# Patient Record
Sex: Male | Born: 1945 | Race: White | Hispanic: No | Marital: Married | Smoking: Former smoker
Health system: Southern US, Community
[De-identification: ages and names within clinical notes are randomized; demographics above are authoritative.]

## PROBLEM LIST (undated history)

## (undated) DIAGNOSIS — E785 Hyperlipidemia, unspecified: Secondary | ICD-10-CM

## (undated) DIAGNOSIS — Z87442 Personal history of urinary calculi: Secondary | ICD-10-CM

## (undated) DIAGNOSIS — G629 Polyneuropathy, unspecified: Secondary | ICD-10-CM

## (undated) DIAGNOSIS — M069 Rheumatoid arthritis, unspecified: Secondary | ICD-10-CM

## (undated) DIAGNOSIS — I10 Essential (primary) hypertension: Secondary | ICD-10-CM

## (undated) DIAGNOSIS — F419 Anxiety disorder, unspecified: Secondary | ICD-10-CM

## (undated) DIAGNOSIS — M1990 Unspecified osteoarthritis, unspecified site: Secondary | ICD-10-CM

## (undated) DIAGNOSIS — F329 Major depressive disorder, single episode, unspecified: Secondary | ICD-10-CM

## (undated) DIAGNOSIS — F32A Depression, unspecified: Secondary | ICD-10-CM

## (undated) DIAGNOSIS — M549 Dorsalgia, unspecified: Secondary | ICD-10-CM

## (undated) DIAGNOSIS — G8929 Other chronic pain: Secondary | ICD-10-CM

## (undated) DIAGNOSIS — G709 Myoneural disorder, unspecified: Secondary | ICD-10-CM

## (undated) DIAGNOSIS — K219 Gastro-esophageal reflux disease without esophagitis: Secondary | ICD-10-CM

## (undated) HISTORY — PX: OTHER SURGICAL HISTORY: SHX169

## (undated) HISTORY — PX: COLONOSCOPY: SHX174

## (undated) HISTORY — PX: HAND / FINGER LESION EXCISION: SUR531

## (undated) HISTORY — DX: Rheumatoid arthritis, unspecified: M06.9

## (undated) HISTORY — PX: TONSILLECTOMY: SUR1361

---

## 2007-07-27 ENCOUNTER — Ambulatory Visit: Payer: Self-pay | Admitting: Cardiology

## 2013-06-03 ENCOUNTER — Other Ambulatory Visit: Payer: Self-pay | Admitting: Neurosurgery

## 2013-06-10 ENCOUNTER — Encounter (HOSPITAL_COMMUNITY): Payer: Self-pay | Admitting: Pharmacy Technician

## 2013-06-12 ENCOUNTER — Encounter (HOSPITAL_COMMUNITY): Payer: Self-pay

## 2013-06-12 ENCOUNTER — Encounter (HOSPITAL_COMMUNITY)
Admission: RE | Admit: 2013-06-12 | Discharge: 2013-06-12 | Disposition: A | Discharge status: Home / Self Care | Payer: Medicare Other | Source: Ambulatory Visit | Attending: Anesthesiology | Admitting: Anesthesiology

## 2013-06-12 ENCOUNTER — Encounter (HOSPITAL_COMMUNITY)
Admission: RE | Admit: 2013-06-12 | Discharge: 2013-06-12 | Disposition: A | Discharge status: Home / Self Care | Payer: Medicare Other | Source: Ambulatory Visit | Attending: Neurosurgery | Admitting: Neurosurgery

## 2013-06-12 HISTORY — DX: Gastro-esophageal reflux disease without esophagitis: K21.9

## 2013-06-12 HISTORY — DX: Essential (primary) hypertension: I10

## 2013-06-12 HISTORY — DX: Anxiety disorder, unspecified: F41.9

## 2013-06-12 HISTORY — DX: Personal history of urinary calculi: Z87.442

## 2013-06-12 LAB — CBC
HCT: 41.4 % (ref 39.0–52.0)
MCH: 33.6 pg (ref 26.0–34.0)
MCHC: 36.5 g/dL — ABNORMAL HIGH (ref 30.0–36.0)
MCV: 92.2 fL (ref 78.0–100.0)
RDW: 12.4 % (ref 11.5–15.5)

## 2013-06-12 LAB — BASIC METABOLIC PANEL
BUN: 18 mg/dL (ref 6–23)
Chloride: 101 mEq/L (ref 96–112)
Creatinine, Ser: 1.16 mg/dL (ref 0.50–1.35)
GFR calc Af Amer: 75 mL/min — ABNORMAL LOW (ref >90)

## 2013-06-12 LAB — SURGICAL PCR SCREEN: MRSA, PCR: NEGATIVE

## 2013-06-12 LAB — TYPE AND SCREEN: Antibody Screen: NEGATIVE

## 2013-06-12 NOTE — Progress Notes (Signed)
req'd stress 08 from [place]  Dr Onalee Hua tapper rec'es. req' ekg to compare. Non done per office.

## 2013-06-12 NOTE — Pre-Procedure Instructions (Addendum)
Ryan Ibarra  06/12/2013   Your procedure is scheduled on:  06/19/13  Report to [facility] at1000 AM.  Call this number if you have problems the morning of surgery: [phone]   Remember:   Do not eat food or drink liquids after midnight.   Take these medicines the morning of surgery with A SIP OF WATER: pain med, paxil, zantac,xanax   Do not wear jewelry, make-up or nail polish.  Do not wear lotions, powders, or perfumes. You may wear deodorant.  Do not shave 48 hours prior to surgery. Men may shave face and neck.  Do not bring valuables to the hospital.  [facility] is not responsible                   for any belongings or valuables.  Contacts, dentures or bridgework may not be worn into surgery.  Leave suitcase in the car. After surgery it may be brought to your room.  For patients admitted to the hospital, checkout time is 11:00 AM the day of  discharge.   Patients discharged the day of surgery will not be allowed to drive  home.  Name and phone number of your driver:   Special Instructions: Shower using CHG 2 nights before surgery and the night before surgery.  If you shower the day of surgery use CHG.  Use special wash - you have one bottle of CHG for all showers.  You should use approximately 1/3 of the bottle for each shower.   Please read over the following fact sheets that you were given: Pain Booklet, Coughing and Deep Breathing, Blood Transfusion Information, MRSA Information and Surgical Site Infection Prevention

## 2013-06-14 NOTE — Progress Notes (Signed)
This is a 67 year old male who is scheduled for multilevel lumbar spine surgery/fusion to be performed by Dr. Coletta Memos on Wednesday 19 June 2013.  A chart review was requested due to an abnormal EKG dated 12 June 2013 which was read as: Normal sinus rhythm with a ventricular rate of 65, septal infract of undetermined age, abnormal EKG, no old tracings to compare.   An EKG dated 11 July 2007 was read as normal. When compared, there appear to be no significant changes. Both tracings appear to be very similar  An exercise stress test was performed at [facility] in [place] in [place] 2008 secondary to "multiple cardiac risk factors and substernal chest pain. "  IMPRESSION: Clinically negative for ischemia. Electrocardiographically negative for ischemia with good exercise tolerance. Perfusion data were essentially negative for ischemia.   Risk factors for coronary artery disease include HTN and tobacco abuse. Past medical history is significant for HTN, COPD and GERD.   Lab results dated 12 June 2013 are unremarkable.  CXR dated 12 June 2013: Emphysema without acute cardiopulmonary disease.  This case was discussed with Dr. Ivin Booty.  May proceed with surgery as scheduled.  Kelton Pillar. Everhart, PA-C

## 2013-06-18 MED ORDER — CEFAZOLIN SODIUM-DEXTROSE 2-3 GM-% IV SOLR
2.0000 g | INTRAVENOUS | Status: AC
  Administered 2013-06-19 (×3): 2 g via INTRAVENOUS
  Filled 2013-06-18: qty 50

## 2013-06-19 ENCOUNTER — Encounter (HOSPITAL_COMMUNITY): Payer: Self-pay | Admitting: *Deleted

## 2013-06-19 ENCOUNTER — Inpatient Hospital Stay (HOSPITAL_COMMUNITY)
Admission: RE | Admit: 2013-06-19 | Discharge: 2013-06-27 | DRG: 459 | Disposition: A | Discharge status: Home / Self Care | Payer: Medicare Other | Source: Ambulatory Visit | Attending: Neurosurgery | Admitting: Neurosurgery

## 2013-06-19 ENCOUNTER — Encounter (HOSPITAL_COMMUNITY): Payer: Self-pay | Admitting: Anesthesiology

## 2013-06-19 ENCOUNTER — Inpatient Hospital Stay (HOSPITAL_COMMUNITY): Payer: Medicare Other | Admitting: Anesthesiology

## 2013-06-19 ENCOUNTER — Inpatient Hospital Stay (HOSPITAL_COMMUNITY): Payer: Medicare Other

## 2013-06-19 ENCOUNTER — Encounter (HOSPITAL_COMMUNITY)
Admission: RE | Disposition: A | Discharge status: Home / Self Care | Payer: Self-pay | Source: Ambulatory Visit | Attending: Neurosurgery

## 2013-06-19 DIAGNOSIS — G609 Hereditary and idiopathic neuropathy, unspecified: Secondary | ICD-10-CM | POA: Diagnosis present

## 2013-06-19 DIAGNOSIS — Z7982 Long term (current) use of aspirin: Secondary | ICD-10-CM

## 2013-06-19 DIAGNOSIS — Y9210 Unspecified residential institution as the place of occurrence of the external cause: Secondary | ICD-10-CM | POA: Diagnosis not present

## 2013-06-19 DIAGNOSIS — Z01812 Encounter for preprocedural laboratory examination: Secondary | ICD-10-CM

## 2013-06-19 DIAGNOSIS — Z01818 Encounter for other preprocedural examination: Secondary | ICD-10-CM

## 2013-06-19 DIAGNOSIS — D62 Acute posthemorrhagic anemia: Secondary | ICD-10-CM | POA: Diagnosis not present

## 2013-06-19 DIAGNOSIS — F411 Generalized anxiety disorder: Secondary | ICD-10-CM | POA: Diagnosis present

## 2013-06-19 DIAGNOSIS — Z01810 Encounter for preprocedural cardiovascular examination: Secondary | ICD-10-CM

## 2013-06-19 DIAGNOSIS — J4489 Other specified chronic obstructive pulmonary disease: Secondary | ICD-10-CM | POA: Diagnosis present

## 2013-06-19 DIAGNOSIS — Z981 Arthrodesis status: Secondary | ICD-10-CM

## 2013-06-19 DIAGNOSIS — M4716 Other spondylosis with myelopathy, lumbar region: Secondary | ICD-10-CM | POA: Diagnosis present

## 2013-06-19 DIAGNOSIS — J449 Chronic obstructive pulmonary disease, unspecified: Secondary | ICD-10-CM | POA: Diagnosis present

## 2013-06-19 DIAGNOSIS — J95821 Acute postprocedural respiratory failure: Secondary | ICD-10-CM | POA: Diagnosis not present

## 2013-06-19 DIAGNOSIS — G959 Disease of spinal cord, unspecified: Secondary | ICD-10-CM | POA: Diagnosis present

## 2013-06-19 DIAGNOSIS — I10 Essential (primary) hypertension: Secondary | ICD-10-CM | POA: Diagnosis present

## 2013-06-19 DIAGNOSIS — M5126 Other intervertebral disc displacement, lumbar region: Secondary | ICD-10-CM | POA: Diagnosis present

## 2013-06-19 DIAGNOSIS — Z87891 Personal history of nicotine dependence: Secondary | ICD-10-CM

## 2013-06-19 DIAGNOSIS — Z79899 Other long term (current) drug therapy: Secondary | ICD-10-CM

## 2013-06-19 DIAGNOSIS — K219 Gastro-esophageal reflux disease without esophagitis: Secondary | ICD-10-CM | POA: Diagnosis present

## 2013-06-19 DIAGNOSIS — M48061 Spinal stenosis, lumbar region without neurogenic claudication: Secondary | ICD-10-CM | POA: Diagnosis present

## 2013-06-19 DIAGNOSIS — M4712 Other spondylosis with myelopathy, cervical region: Secondary | ICD-10-CM | POA: Diagnosis present

## 2013-06-19 DIAGNOSIS — Q762 Congenital spondylolisthesis: Principal | ICD-10-CM

## 2013-06-19 DIAGNOSIS — I9589 Other hypotension: Secondary | ICD-10-CM | POA: Diagnosis not present

## 2013-06-19 DIAGNOSIS — T411X5A Adverse effect of intravenous anesthetics, initial encounter: Secondary | ICD-10-CM | POA: Diagnosis not present

## 2013-06-19 HISTORY — DX: Myoneural disorder, unspecified: G70.9

## 2013-06-19 SURGERY — POSTERIOR LUMBAR FUSION 3 LEVEL
Anesthesia: General | Site: Back | Wound class: Clean

## 2013-06-19 MED ORDER — MIDAZOLAM HCL 2 MG/2ML IJ SOLN: 0.5000 mg | Freq: Once | INTRAMUSCULAR | Status: AC | PRN

## 2013-06-19 MED ORDER — CEFAZOLIN SODIUM-DEXTROSE 2-3 GM-% IV SOLR
INTRAVENOUS | Status: AC
  Filled 2013-06-19: qty 50

## 2013-06-19 MED ORDER — PHENYLEPHRINE HCL 10 MG/ML IJ SOLN
INTRAMUSCULAR | Status: DC | PRN
  Administered 2013-06-19 (×2): 40 ug via INTRAVENOUS
  Administered 2013-06-19 (×2): 80 ug via INTRAVENOUS
  Administered 2013-06-19: 40 ug via INTRAVENOUS

## 2013-06-19 MED ORDER — ALBUMIN HUMAN 5 % IV SOLN
INTRAVENOUS | Status: DC | PRN
  Administered 2013-06-19: 17:00:00 via INTRAVENOUS

## 2013-06-19 MED ORDER — 0.9 % SODIUM CHLORIDE (POUR BTL) OPTIME
TOPICAL | Status: DC | PRN
  Administered 2013-06-19 (×2): 1000 mL

## 2013-06-19 MED ORDER — LACTATED RINGERS IV SOLN
INTRAVENOUS | Status: DC | PRN
  Administered 2013-06-19 (×2): via INTRAVENOUS

## 2013-06-19 MED ORDER — MEPERIDINE HCL 25 MG/ML IJ SOLN: 6.2500 mg | INTRAMUSCULAR | Status: DC | PRN

## 2013-06-19 MED ORDER — SODIUM CHLORIDE 0.9 % IV SOLN
INTRAVENOUS | Status: DC | PRN
  Administered 2013-06-19: 18:00:00 via INTRAVENOUS

## 2013-06-19 MED ORDER — HYDROMORPHONE HCL PF 1 MG/ML IJ SOLN: 0.2500 mg | INTRAMUSCULAR | Status: DC | PRN

## 2013-06-19 MED ORDER — OXYCODONE HCL 5 MG PO TABS: 5.0000 mg | ORAL_TABLET | Freq: Once | ORAL | Status: AC | PRN

## 2013-06-19 MED ORDER — MIDAZOLAM HCL 5 MG/5ML IJ SOLN
INTRAMUSCULAR | Status: DC | PRN
  Administered 2013-06-19: 2 mg via INTRAVENOUS

## 2013-06-19 MED ORDER — FENTANYL CITRATE 0.05 MG/ML IJ SOLN
INTRAMUSCULAR | Status: DC | PRN
  Administered 2013-06-19 (×2): 100 ug via INTRAVENOUS
  Administered 2013-06-19: 50 ug via INTRAVENOUS
  Administered 2013-06-19: 100 ug via INTRAVENOUS
  Administered 2013-06-19: 50 ug via INTRAVENOUS
  Administered 2013-06-19: 100 ug via INTRAVENOUS
  Administered 2013-06-19: 50 ug via INTRAVENOUS
  Administered 2013-06-19: 200 ug via INTRAVENOUS
  Administered 2013-06-19 (×2): 100 ug via INTRAVENOUS
  Administered 2013-06-19: 50 ug via INTRAVENOUS

## 2013-06-19 MED ORDER — PROMETHAZINE HCL 25 MG/ML IJ SOLN: 6.2500 mg | INTRAMUSCULAR | Status: DC | PRN

## 2013-06-19 MED ORDER — VECURONIUM BROMIDE 10 MG IV SOLR
INTRAVENOUS | Status: DC | PRN
  Administered 2013-06-19: 1 mg via INTRAVENOUS
  Administered 2013-06-19: 3 mg via INTRAVENOUS
  Administered 2013-06-19: 1 mg via INTRAVENOUS
  Administered 2013-06-19 (×2): 2 mg via INTRAVENOUS
  Administered 2013-06-19: 1 mg via INTRAVENOUS

## 2013-06-19 MED ORDER — ROCURONIUM BROMIDE 100 MG/10ML IV SOLN
INTRAVENOUS | Status: DC | PRN
  Administered 2013-06-19 (×2): 10 mg via INTRAVENOUS
  Administered 2013-06-19: 20 mg via INTRAVENOUS
  Administered 2013-06-19: 10 mg via INTRAVENOUS
  Administered 2013-06-19: 50 mg via INTRAVENOUS
  Administered 2013-06-19: 10 mg via INTRAVENOUS
  Administered 2013-06-19: 20 mg via INTRAVENOUS
  Administered 2013-06-20: 10 mg via INTRAVENOUS

## 2013-06-19 MED ORDER — THROMBIN 20000 UNITS EX SOLR
CUTANEOUS | Status: DC | PRN
  Administered 2013-06-19 (×2): via TOPICAL

## 2013-06-19 MED ORDER — LIDOCAINE-EPINEPHRINE 0.5 %-1:200000 IJ SOLN
INTRAMUSCULAR | Status: DC | PRN
  Administered 2013-06-19: 10 mL

## 2013-06-19 MED ORDER — PHENYLEPHRINE HCL 10 MG/ML IJ SOLN
10.0000 mg | INTRAVENOUS | Status: DC | PRN
  Administered 2013-06-19: 15 ug/min via INTRAVENOUS

## 2013-06-19 MED ORDER — OXYCODONE HCL 5 MG/5ML PO SOLN: 5.0000 mg | Freq: Once | ORAL | Status: AC | PRN

## 2013-06-19 MED ORDER — LIDOCAINE HCL (CARDIAC) 20 MG/ML IV SOLN
INTRAVENOUS | Status: DC | PRN
  Administered 2013-06-19: 20 mg via INTRAVENOUS

## 2013-06-19 MED ORDER — LACTATED RINGERS IV SOLN
INTRAVENOUS | Status: DC | PRN
  Administered 2013-06-19 (×5): via INTRAVENOUS

## 2013-06-19 MED ORDER — SUCCINYLCHOLINE CHLORIDE 20 MG/ML IJ SOLN
INTRAMUSCULAR | Status: DC | PRN
  Administered 2013-06-19: 100 mg via INTRAVENOUS

## 2013-06-19 MED ORDER — EPHEDRINE SULFATE 50 MG/ML IJ SOLN
INTRAMUSCULAR | Status: DC | PRN
  Administered 2013-06-19 (×3): 5 mg via INTRAVENOUS

## 2013-06-19 MED ORDER — PROPOFOL 10 MG/ML IV BOLUS
INTRAVENOUS | Status: DC | PRN
  Administered 2013-06-19: 150 mg via INTRAVENOUS

## 2013-06-19 MED ORDER — ARTIFICIAL TEARS OP OINT
TOPICAL_OINTMENT | OPHTHALMIC | Status: DC | PRN
  Administered 2013-06-19: 1 via OPHTHALMIC

## 2013-06-19 SURGICAL SUPPLY — 79 items
BAG DECANTER FOR FLEXI CONT (MISCELLANEOUS) IMPLANT
BENZOIN TINCTURE PRP APPL 2/3 (GAUZE/BANDAGES/DRESSINGS) IMPLANT
BLADE SURG 15 STRL LF DISP TIS (BLADE) ×1 IMPLANT
BLADE SURG 15 STRL SS (BLADE) ×1
BLADE SURG ROTATE 9660 (MISCELLANEOUS) IMPLANT
BUR MATCHSTICK NEURO 3.0 LAGG (BURR) ×2 IMPLANT
BUR ROUND FLUTED 5 RND (BURR) ×2 IMPLANT
CAGE CAPSTONE 10X22 SPINE (Cage) ×6 IMPLANT
CANISTER SUCTION 2500CC (MISCELLANEOUS) ×2 IMPLANT
CLOTH BEACON ORANGE TIMEOUT ST (SAFETY) ×2 IMPLANT
CONT SPEC 4OZ CLIKSEAL STRL BL (MISCELLANEOUS) ×4 IMPLANT
COVER BACK TABLE 24X17X13 BIG (DRAPES) IMPLANT
CROSSLINK 4.75X10 47-60MM (Plate) ×2 IMPLANT
CROSSLINK 41MM-10 (Trauma) ×1 IMPLANT
DECANTER SPIKE VIAL GLASS SM (MISCELLANEOUS) IMPLANT
DERMABOND ADVANCED (GAUZE/BANDAGES/DRESSINGS)
DERMABOND ADVANCED .7 DNX12 (GAUZE/BANDAGES/DRESSINGS) IMPLANT
DRAPE C-ARM 42X72 X-RAY (DRAPES) ×2 IMPLANT
DRAPE LAPAROTOMY 100X72X124 (DRAPES) ×2 IMPLANT
DRAPE POUCH INSTRU U-SHP 10X18 (DRAPES) ×2 IMPLANT
DRAPE PROXIMA HALF (DRAPES) ×2 IMPLANT
DRAPE SURG 17X23 STRL (DRAPES) ×2 IMPLANT
DRESSING TELFA 8X3 (GAUZE/BANDAGES/DRESSINGS) IMPLANT
DURAPREP 26ML APPLICATOR (WOUND CARE) ×2 IMPLANT
ELECT REM PT RETURN 9FT ADLT (ELECTROSURGICAL) ×2
ELECTRODE REM PT RTRN 9FT ADLT (ELECTROSURGICAL) ×1 IMPLANT
EVACUATOR 1/8 PVC DRAIN (DRAIN) ×2 IMPLANT
GAUZE SPONGE 4X4 16PLY XRAY LF (GAUZE/BANDAGES/DRESSINGS) ×2 IMPLANT
GLOVE BIO SURGEON STRL SZ 6.5 (GLOVE) ×8 IMPLANT
GLOVE BIOGEL PI IND STRL 6.5 (GLOVE) ×2 IMPLANT
GLOVE BIOGEL PI IND STRL 8 (GLOVE) ×1 IMPLANT
GLOVE BIOGEL PI IND STRL 8.5 (GLOVE) ×1 IMPLANT
GLOVE BIOGEL PI INDICATOR 6.5 (GLOVE) ×2
GLOVE BIOGEL PI INDICATOR 8 (GLOVE) ×1
GLOVE BIOGEL PI INDICATOR 8.5 (GLOVE) ×1
GLOVE ECLIPSE 6.5 STRL STRAW (GLOVE) ×8 IMPLANT
GLOVE ECLIPSE 7.5 STRL STRAW (GLOVE) ×2 IMPLANT
GLOVE EXAM NITRILE LRG STRL (GLOVE) ×2 IMPLANT
GLOVE EXAM NITRILE MD LF STRL (GLOVE) IMPLANT
GLOVE EXAM NITRILE XL STR (GLOVE) IMPLANT
GLOVE EXAM NITRILE XS STR PU (GLOVE) IMPLANT
GLOVE SURG SS PI 8.0 STRL IVOR (GLOVE) ×4 IMPLANT
GOWN BRE IMP SLV AUR LG STRL (GOWN DISPOSABLE) ×12 IMPLANT
GOWN BRE IMP SLV AUR XL STRL (GOWN DISPOSABLE) IMPLANT
GOWN STRL REIN 2XL LVL4 (GOWN DISPOSABLE) ×2 IMPLANT
KIT BASIN OR (CUSTOM PROCEDURE TRAY) ×2 IMPLANT
KIT POSITION SURG JACKSON T1 (MISCELLANEOUS) ×2 IMPLANT
KIT ROOM TURNOVER OR (KITS) ×2 IMPLANT
MILL MEDIUM DISP (BLADE) ×2 IMPLANT
NEEDLE HYPO 25X1 1.5 SAFETY (NEEDLE) ×2 IMPLANT
NEEDLE SPNL 18GX3.5 QUINCKE PK (NEEDLE) ×2 IMPLANT
NS IRRIG 1000ML POUR BTL (IV SOLUTION) ×4 IMPLANT
PACK LAMINECTOMY NEURO (CUSTOM PROCEDURE TRAY) ×2 IMPLANT
PAD ARMBOARD 7.5X6 YLW CONV (MISCELLANEOUS) ×2 IMPLANT
PATTIES SURGICAL .5 X.5 (GAUZE/BANDAGES/DRESSINGS) ×2 IMPLANT
PATTIES SURGICAL 1X1 (DISPOSABLE) ×2 IMPLANT
PLATE ANTC HORIZ 4.75X41 NS (Trauma) ×1 IMPLANT
ROD SOLERA 80MM (Rod) ×2 IMPLANT
ROD SOLERA 80X4.75X (Rod) ×2 IMPLANT
SCREW MAS 6.5X40 (Screw) ×2 IMPLANT
SCREW MAS 6.5X45 (Screw) ×6 IMPLANT
SCREW MAS 6.5X50 (Screw) ×8 IMPLANT
SCREW SET SOLERA (Screw) ×8 IMPLANT
SCREW SET SOLERA TI (Screw) ×8 IMPLANT
SPONGE GAUZE 4X4 12PLY (GAUZE/BANDAGES/DRESSINGS) IMPLANT
SPONGE LAP 4X18 X RAY DECT (DISPOSABLE) ×2 IMPLANT
SPONGE SURGIFOAM ABS GEL 100 (HEMOSTASIS) ×2 IMPLANT
STRIP CLOSURE SKIN 1/2X4 (GAUZE/BANDAGES/DRESSINGS) IMPLANT
SUT PROLENE 6 0 BV (SUTURE) IMPLANT
SUT VIC AB 0 CT1 18XCR BRD8 (SUTURE) ×3 IMPLANT
SUT VIC AB 0 CT1 8-18 (SUTURE) ×3
SUT VIC AB 2-0 CT1 18 (SUTURE) ×4 IMPLANT
SUT VIC AB 3-0 SH 8-18 (SUTURE) ×4 IMPLANT
SYR 20ML ECCENTRIC (SYRINGE) ×2 IMPLANT
TOWEL OR 17X24 6PK STRL BLUE (TOWEL DISPOSABLE) ×2 IMPLANT
TOWEL OR 17X26 10 PK STRL BLUE (TOWEL DISPOSABLE) ×2 IMPLANT
TRAY FOLEY CATH 14FRSI W/METER (CATHETERS) ×2 IMPLANT
WATER STERILE IRR 1000ML POUR (IV SOLUTION) ×2 IMPLANT
interbody fusion device ×4 IMPLANT

## 2013-06-19 NOTE — H&P (Signed)
BP 109/71  Pulse 62  Temp(Src) 97.1 F (36.2 C) (Oral)  Resp 20  SpO2 97%    Mr. Ryan Ibarra is a 67 year old gentleman who presents today for evaluation of cervical stenosis, lumbar stenosis, and lower extremity pain. Mr. Ryan Ibarra saw me previously in 2007. At that time, he had significant cervical stenosis. He was not, however, myelopathic. At that time, I wanted to see him on a continuing basis every six months, but Mr. Ryan Ibarra did not see me after that. At that time, he reported pain in the hips and his proximal leg, right greater than left. He had dysesthesias into his thighs and said that pain had been going on for five years. When he was seen in 2001 by Dr. Roxan Hockey, my partner, Dr. Roxan Hockey had a strong inclination that he might have peripheral neuropathy. I saw him in 2007, and again, he had no evidence of myelopathy but had a great deal of change in the cervical spine. He was spondylitic at L4-5, had a herniated disk at L4-5 and L5-S1, and did not need surgery at that time.   He returns today with severe pain in the lower extremities. He has little to no back pain, but does have weakness in his legs and has prominent symptoms in the legs. He tried a great deal of pain medications to try to control his pain. He had no bowel or bladder dysfunction. He does urinate very frequently and says he has to get up, in the course of the night, multiple times. He has a great deal of difficulty with his gait and appears to be off-balance. He says the dexterity in his hands is very good, and he does not have a problem with that.   REVIEW OF SYSTEMS:                                    Positive for eye glasses, cataracts, tinnitus, balance problems, leg pain with walking, leg pain. Denies constitutional, respiratory, genitourinary, neurological, skin, psychiatric, endocrine, hematologic, or allergic disorder. 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 PAST MEDICAL  HISTORY:                                           Current Medical Conditions:  Significant for hypertension, COPD, cervical degenerative disease, lumbar degenerative disease. He has peripheral neuropathy; etiology is unknown.            Medications and Allergies:  Medications are Zocor, lisinopril, Paxil, trazodone, gabapentin, meloxicam, Percocet 10/325, two tablets every six hours for severe pain, Zantac, baby aspirin 81 mg, and a multivitamin.   PHYSICAL EXAMINATION:                                           Extremities - He has markedly apparently spastic gait. He does not flex the hip in the right lower extremity as opposed to simply rotating it out in Trendelenburg-like fashion. He bounces significantly when he walks. He has a positive Romberg examination.   NEUROLOGICAL EXAMINATION:                      Cranial  Nerve Examination -  Pupils equal, round, and reactive to light. Full extraocular movements. Full visual fields. Hearing intact to voice bilaterally. Uvula elevates midline. Shoulder shrug is normal. Tongue protrudes in the midline. Speech is clear and fluent. Fund of knowledge is normal.            Sensory Examination - He has decreased proprioception of both great toes, but the left is worse than the right. Significant decrease in pinprick in both legs, extends more proximally in the right side, almost to the level of the knee; on the left side anteriorly, approximately mid-calf and mid-shin. He has normal pinprick in the upper extremities. Proprioception is normal in the upper extremities. No Hoffman sign was elicited on my examination today.            Deep Tendon Reflexes - He does not have clonus. Toes are downgoing to plantar stimulation. He is hyper-reflexive, at least 3+ at the ankles, 3+ biceps, triceps, and brachioradialis.   DIAGNOSTIC STUDIES:                                    MRI of the cervical spine was reviewed. It shows cord signal behind C3-4, stenotic at  C4-5. Also, foraminal narrowing present in multiple levels. He has severe lumbar stenosis at L3-4 and at L4-5. He has a listhesis at L3 on L4. He has a synovial cyst, which is closing off the foramen on the left side at L2-3 and facet arthropathy at L2-3, L3-4, and L4-5. The absolute worse level is probably L4-5. Some fluid in the joints present there also. Conus and equina otherwise normal. Significant modic changes present at L5-S1. The conus is otherwise normal. Cauda equina is otherwise normal. He also has modic changes present at L4-5. Some at L2-3 in the disc. The discs at L1-2, L2-3, L3-4, L4-5, L5-S1 are all narrowed and degenerated.   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 Ryan Ibarra states right now that his biggest problem is his lower extremities. Given the amount of stenosis and the other multiple findings, I think he probably has contributions from his neck and lower back, but the lower back is severe enough that I think it is okay to proceed with that first. He needs to be decompressed, and secondary to the facet arthropathy and the known listhesis that he has and the facet arthropathy and the synovial cyst at L2-3, I think he does need a fusion at L2-3, L3-4, and L4-5 along with the lumbar decompression done via posterior approach. Risks and benefits: Bleeding, infection, no relief, need for further surgery, fusion failure, hardware failure, no pain relief were all discussed. He understands and wishes to proceed.

## 2013-06-19 NOTE — Anesthesia Procedure Notes (Addendum)
Performed by: Lovie Chol    Procedure Name: Intubation Date/Time: 06/19/2013 3:05 PM Performed by: Lovie Chol Pre-anesthesia Checklist: Patient identified, Emergency Drugs available, Suction available, Patient being monitored and Timeout performed Patient Re-evaluated:Patient Re-evaluated prior to inductionOxygen Delivery Method: Circle system utilized Preoxygenation: Pre-oxygenation with 100% oxygen Intubation Type: IV induction, Rapid sequence and Cricoid Pressure applied Laryngoscope Size: Miller and 2 Grade View: Grade I Tube type: Oral Tube size: 7.5 mm Number of attempts: 1 Airway Equipment and Method: Stylet Placement Confirmation: ETT inserted through vocal cords under direct vision,  positive ETCO2,  CO2 detector and breath sounds checked- equal and bilateral Secured at: 22 cm Tube secured with: Tape Dental Injury: Teeth and Oropharynx as per pre-operative assessment

## 2013-06-19 NOTE — Anesthesia Preprocedure Evaluation (Addendum)
Anesthesia Evaluation  Patient identified by MRN, date of birth, ID band Patient awake    Reviewed: Allergy & Precautions, H&P , NPO status , Patient's Chart, lab work & pertinent test results  History of Anesthesia Complications Negative for: history of anesthetic complications  Airway Mallampati: II TM Distance: >3 FB Neck ROM: Full    Dental  (+) Edentulous Upper, Edentulous Lower and Dental Advisory Given   Pulmonary former smoker (Quit '11),  breath sounds clear to auscultation  Pulmonary exam normal       Cardiovascular hypertension, Pt. on medications Rhythm:Regular Rate:Normal     Neuro/Psych Anxiety Chronic back pain: narcotics    GI/Hepatic Neg liver ROS, GERD-  Medicated and Poorly Controlled,  Endo/Other  negative endocrine ROS  Renal/GU negative Renal ROS     Musculoskeletal   Abdominal   Peds  Hematology negative hematology ROS (+)   Anesthesia Other Findings   Reproductive/Obstetrics                         Anesthesia Physical Anesthesia Plan  ASA: III  Anesthesia Plan: General   Post-op Pain Management:    Induction: Intravenous  Airway Management Planned: Oral ETT  Additional Equipment:   Intra-op Plan:   Post-operative Plan: Extubation in OR  Informed Consent: I have reviewed the patients History and Physical, chart, labs and discussed the procedure including the risks, benefits and alternatives for the proposed anesthesia with the patient or authorized representative who has indicated his/her understanding and acceptance.     Plan Discussed with: CRNA and Surgeon  Anesthesia Plan Comments: (Plan routine monitors, GETA)        Anesthesia Quick Evaluation

## 2013-06-19 NOTE — Preoperative (Signed)
Beta Blockers   Reason not to administer Beta Blockers:Not Applicable 

## 2013-06-19 NOTE — OR Nursing (Signed)
Dr. Newell Coral updated family, spoke with wife by phone twice during procedure

## 2013-06-20 ENCOUNTER — Inpatient Hospital Stay (HOSPITAL_COMMUNITY): Payer: Medicare Other

## 2013-06-20 DIAGNOSIS — M48061 Spinal stenosis, lumbar region without neurogenic claudication: Secondary | ICD-10-CM | POA: Diagnosis present

## 2013-06-20 DIAGNOSIS — G959 Disease of spinal cord, unspecified: Secondary | ICD-10-CM | POA: Diagnosis present

## 2013-06-20 DIAGNOSIS — J95821 Acute postprocedural respiratory failure: Secondary | ICD-10-CM | POA: Diagnosis not present

## 2013-06-20 DIAGNOSIS — Z981 Arthrodesis status: Secondary | ICD-10-CM

## 2013-06-20 DIAGNOSIS — J9600 Acute respiratory failure, unspecified whether with hypoxia or hypercapnia: Secondary | ICD-10-CM

## 2013-06-20 LAB — CBC
HCT: 35 % — ABNORMAL LOW (ref 39.0–52.0)
Hemoglobin: 12.3 g/dL — ABNORMAL LOW (ref 13.0–17.0)
RBC: 3.62 MIL/uL — ABNORMAL LOW (ref 4.22–5.81)
WBC: 14.5 10*3/uL — ABNORMAL HIGH (ref 4.0–10.5)

## 2013-06-20 LAB — BLOOD GAS, ARTERIAL
MECHVT: 530 mL
O2 Saturation: 99.3 %
PEEP: 5 cmH2O
Patient temperature: 98.5
TCO2: 24.2 mmol/L (ref 0–100)
pH, Arterial: 7.39 (ref 7.350–7.450)

## 2013-06-20 LAB — BASIC METABOLIC PANEL
BUN: 15 mg/dL (ref 6–23)
Chloride: 107 mEq/L (ref 96–112)
Glucose, Bld: 137 mg/dL — ABNORMAL HIGH (ref 70–99)
Potassium: 4.6 mEq/L (ref 3.5–5.1)

## 2013-06-20 LAB — POCT I-STAT 4, (NA,K, GLUC, HGB,HCT)
Glucose, Bld: 107 mg/dL — ABNORMAL HIGH (ref 70–99)
Potassium: 4.3 mEq/L (ref 3.5–5.1)
Sodium: 139 mEq/L (ref 135–145)

## 2013-06-20 MED ORDER — ONDANSETRON HCL 4 MG/2ML IJ SOLN
4.0000 mg | INTRAMUSCULAR | Status: DC | PRN
  Administered 2013-06-20: 4 mg via INTRAVENOUS
  Filled 2013-06-20: qty 2

## 2013-06-20 MED ORDER — TRAZODONE HCL 150 MG PO TABS: 150.0000 mg | ORAL_TABLET | Freq: Every day | ORAL | Status: DC

## 2013-06-20 MED ORDER — DIAZEPAM 5 MG PO TABS
5.0000 mg | ORAL_TABLET | Freq: Four times a day (QID) | ORAL | Status: DC | PRN
  Administered 2013-06-20 – 2013-06-25 (×11): 5 mg via ORAL
  Filled 2013-06-20 (×13): qty 1

## 2013-06-20 MED ORDER — SODIUM CHLORIDE 0.9 % IV BOLUS (SEPSIS)
750.0000 mL | Freq: Once | INTRAVENOUS | Status: AC
  Administered 2013-06-20: 750 mL via INTRAVENOUS

## 2013-06-20 MED ORDER — NALOXONE HCL 0.4 MG/ML IJ SOLN: 0.4000 mg | INTRAMUSCULAR | Status: DC | PRN

## 2013-06-20 MED ORDER — HYDROMORPHONE 0.3 MG/ML IV SOLN
INTRAVENOUS | Status: DC
  Administered 2013-06-20: 4.8 mg via INTRAVENOUS
  Administered 2013-06-20: 1.96 mg via INTRAVENOUS
  Administered 2013-06-20 (×2): via INTRAVENOUS
  Administered 2013-06-21: 2.1 mg via INTRAVENOUS
  Administered 2013-06-21: 16:00:00 via INTRAVENOUS
  Administered 2013-06-21: 4 mg via INTRAVENOUS
  Administered 2013-06-21: 5.7 mg via INTRAVENOUS
  Administered 2013-06-21: 3.6 mg via INTRAVENOUS
  Administered 2013-06-21: 1.94 mg via INTRAVENOUS
  Administered 2013-06-21: 3 mg via INTRAVENOUS
  Administered 2013-06-21: 1.5 mg via INTRAVENOUS
  Administered 2013-06-22: 4.8 mg via INTRAVENOUS
  Administered 2013-06-22: 0.776 mg via INTRAVENOUS
  Administered 2013-06-22: 09:00:00 via INTRAVENOUS
  Administered 2013-06-22: 3.22 mg via INTRAVENOUS
  Administered 2013-06-23: 08:00:00 via INTRAVENOUS
  Administered 2013-06-23: 1.8 mg via INTRAVENOUS
  Administered 2013-06-23: 3.6 mg via INTRAVENOUS
  Administered 2013-06-23: 3 mg via INTRAVENOUS
  Administered 2013-06-24: 2.55 mg via INTRAVENOUS
  Administered 2013-06-24 (×2): via INTRAVENOUS
  Administered 2013-06-25: 2.7 mg via INTRAVENOUS
  Administered 2013-06-25: 06:00:00 via INTRAVENOUS
  Administered 2013-06-25: 2.1 mg via INTRAVENOUS
  Filled 2013-06-20 (×14): qty 25

## 2013-06-20 MED ORDER — GABAPENTIN 300 MG PO CAPS: 900.0000 mg | ORAL_CAPSULE | Freq: Every day | ORAL | Status: DC

## 2013-06-20 MED ORDER — OXYCODONE-ACETAMINOPHEN 5-325 MG PO TABS: 1.0000 | ORAL_TABLET | ORAL | Status: DC | PRN

## 2013-06-20 MED ORDER — LISINOPRIL 20 MG PO TABS: 20.0000 mg | ORAL_TABLET | Freq: Every day | ORAL | Status: DC

## 2013-06-20 MED ORDER — SIMVASTATIN 40 MG PO TABS: 40.0000 mg | ORAL_TABLET | Freq: Every evening | ORAL | Status: DC

## 2013-06-20 MED ORDER — ALBUTEROL SULFATE HFA 108 (90 BASE) MCG/ACT IN AERS
8.0000 | INHALATION_SPRAY | Freq: Four times a day (QID) | RESPIRATORY_TRACT | Status: DC
  Administered 2013-06-20 (×2): 8 via RESPIRATORY_TRACT
  Filled 2013-06-20: qty 6.7

## 2013-06-20 MED ORDER — ADULT MULTIVITAMIN W/MINERALS CH: 1.0000 | ORAL_TABLET | Freq: Every day | ORAL | Status: DC

## 2013-06-20 MED ORDER — ACETAMINOPHEN 650 MG RE SUPP: 650.0000 mg | RECTAL | Status: DC | PRN

## 2013-06-20 MED ORDER — DIPHENHYDRAMINE HCL 50 MG/ML IJ SOLN: 12.5000 mg | Freq: Four times a day (QID) | INTRAMUSCULAR | Status: DC | PRN

## 2013-06-20 MED ORDER — PROPOFOL 10 MG/ML IV EMUL
INTRAVENOUS | Status: AC
  Filled 2013-06-20: qty 100

## 2013-06-20 MED ORDER — ALBUTEROL SULFATE (5 MG/ML) 0.5% IN NEBU: 2.5000 mg | INHALATION_SOLUTION | RESPIRATORY_TRACT | Status: DC | PRN

## 2013-06-20 MED ORDER — ASPIRIN EC 81 MG PO TBEC
81.0000 mg | DELAYED_RELEASE_TABLET | Freq: Every day | ORAL | Status: DC
  Administered 2013-06-20 – 2013-06-27 (×8): 81 mg via ORAL
  Filled 2013-06-20 (×8): qty 1

## 2013-06-20 MED ORDER — SODIUM CHLORIDE 0.9 % IJ SOLN: 3.0000 mL | Freq: Two times a day (BID) | INTRAMUSCULAR | Status: DC

## 2013-06-20 MED ORDER — PROPOFOL INFUSION 10 MG/ML OPTIME
INTRAVENOUS | Status: DC | PRN
  Administered 2013-06-20: 50 ug/kg/min via INTRAVENOUS

## 2013-06-20 MED ORDER — BUPIVACAINE HCL (PF) 0.25 % IJ SOLN
INTRAMUSCULAR | Status: DC | PRN
  Administered 2013-06-20: 30 mL

## 2013-06-20 MED ORDER — MORPHINE SULFATE 2 MG/ML IJ SOLN
2.0000 mg | INTRAMUSCULAR | Status: DC | PRN
  Administered 2013-06-20 (×2): 4 mg via INTRAVENOUS
  Administered 2013-06-20: 6 mg via INTRAVENOUS
  Filled 2013-06-20: qty 2
  Filled 2013-06-20: qty 3
  Filled 2013-06-20: qty 2

## 2013-06-20 MED ORDER — FENTANYL CITRATE 0.05 MG/ML IJ SOLN
50.0000 ug | INTRAMUSCULAR | Status: DC | PRN
  Administered 2013-06-20: 50 ug via INTRAVENOUS

## 2013-06-20 MED ORDER — GABAPENTIN 300 MG PO CAPS
600.0000 mg | ORAL_CAPSULE | Freq: Two times a day (BID) | ORAL | Status: DC
  Filled 2013-06-20: qty 2

## 2013-06-20 MED ORDER — TRAZODONE HCL 150 MG PO TABS
150.0000 mg | ORAL_TABLET | Freq: Every evening | ORAL | Status: DC | PRN
Start: 2013-06-20 — End: 2013-06-27
  Filled 2013-06-20 (×2): qty 1

## 2013-06-20 MED ORDER — CEFAZOLIN SODIUM-DEXTROSE 2-3 GM-% IV SOLR
2.0000 g | Freq: Three times a day (TID) | INTRAVENOUS | Status: AC
  Administered 2013-06-20 (×2): 2 g via INTRAVENOUS
  Filled 2013-06-20 (×2): qty 50

## 2013-06-20 MED ORDER — GABAPENTIN 300 MG PO CAPS: 600.0000 mg | ORAL_CAPSULE | Freq: Three times a day (TID) | ORAL | Status: DC

## 2013-06-20 MED ORDER — POTASSIUM CHLORIDE IN NACL 20-0.9 MEQ/L-% IV SOLN
INTRAVENOUS | Status: DC
  Administered 2013-06-20 – 2013-06-24 (×11): via INTRAVENOUS
  Filled 2013-06-20 (×17): qty 1000

## 2013-06-20 MED ORDER — SENNA 8.6 MG PO TABS
1.0000 | ORAL_TABLET | Freq: Two times a day (BID) | ORAL | Status: DC
  Filled 2013-06-20: qty 1

## 2013-06-20 MED ORDER — ALBUTEROL SULFATE HFA 108 (90 BASE) MCG/ACT IN AERS: 8.0000 | INHALATION_SPRAY | RESPIRATORY_TRACT | Status: DC | PRN

## 2013-06-20 MED ORDER — POLYETHYLENE GLYCOL 3350 17 G PO PACK
17.0000 g | PACK | Freq: Every day | ORAL | Status: DC | PRN
  Filled 2013-06-20: qty 1

## 2013-06-20 MED ORDER — DIPHENHYDRAMINE HCL 12.5 MG/5ML PO ELIX: 12.5000 mg | ORAL_SOLUTION | Freq: Four times a day (QID) | ORAL | Status: DC | PRN

## 2013-06-20 MED ORDER — ONDANSETRON HCL 4 MG/2ML IJ SOLN: 4.0000 mg | Freq: Four times a day (QID) | INTRAMUSCULAR | Status: DC | PRN

## 2013-06-20 MED ORDER — HYDROMORPHONE 0.3 MG/ML IV SOLN: INTRAVENOUS | Status: DC

## 2013-06-20 MED ORDER — ACETAMINOPHEN 325 MG PO TABS
650.0000 mg | ORAL_TABLET | ORAL | Status: DC | PRN
  Administered 2013-06-23: 650 mg via ORAL
  Filled 2013-06-20: qty 2
  Filled 2013-06-20: qty 1

## 2013-06-20 MED ORDER — SODIUM CHLORIDE 0.9 % IV SOLN: 250.0000 mL | INTRAVENOUS | Status: DC

## 2013-06-20 MED ORDER — PAROXETINE HCL 20 MG PO TABS
20.0000 mg | ORAL_TABLET | ORAL | Status: DC
Start: 2013-06-20 — End: 2013-06-20
  Filled 2013-06-20: qty 1

## 2013-06-20 MED ORDER — PHENOL 1.4 % MT LIQD: 1.0000 | OROMUCOSAL | Status: DC | PRN

## 2013-06-20 MED ORDER — PROPOFOL 10 MG/ML IV EMUL
5.0000 ug/kg/min | INTRAVENOUS | Status: DC
  Administered 2013-06-20: 50 ug/kg/min via INTRAVENOUS

## 2013-06-20 MED ORDER — OXYCODONE HCL 5 MG PO TABS
5.0000 mg | ORAL_TABLET | ORAL | Status: DC | PRN
  Administered 2013-06-20 – 2013-06-25 (×25): 10 mg via ORAL
  Filled 2013-06-20 (×26): qty 2

## 2013-06-20 MED ORDER — IPRATROPIUM BROMIDE 0.02 % IN SOLN: 0.5000 mg | RESPIRATORY_TRACT | Status: DC | PRN

## 2013-06-20 MED ORDER — SIMVASTATIN 40 MG PO TABS
40.0000 mg | ORAL_TABLET | Freq: Every day | ORAL | Status: DC
  Administered 2013-06-20 – 2013-06-25 (×5): 40 mg via ORAL
  Filled 2013-06-20 (×8): qty 1

## 2013-06-20 MED ORDER — DIPHENHYDRAMINE HCL 12.5 MG/5ML PO ELIX
12.5000 mg | ORAL_SOLUTION | Freq: Four times a day (QID) | ORAL | Status: DC | PRN
  Filled 2013-06-20: qty 5

## 2013-06-20 MED ORDER — HYDROCODONE-ACETAMINOPHEN 5-325 MG PO TABS
1.0000 | ORAL_TABLET | ORAL | Status: DC | PRN
Start: 2013-06-20 — End: 2013-06-20

## 2013-06-20 MED ORDER — SODIUM CHLORIDE 0.9 % IJ SOLN: 9.0000 mL | INTRAMUSCULAR | Status: DC | PRN

## 2013-06-20 MED ORDER — FENTANYL CITRATE 0.05 MG/ML IJ SOLN
INTRAMUSCULAR | Status: AC
  Filled 2013-06-20: qty 2

## 2013-06-20 MED ORDER — FAMOTIDINE 40 MG PO TABS: 40.0000 mg | ORAL_TABLET | Freq: Every day | ORAL | Status: DC

## 2013-06-20 MED ORDER — SODIUM CHLORIDE 0.9 % IJ SOLN: 3.0000 mL | INTRAMUSCULAR | Status: DC | PRN

## 2013-06-20 MED ORDER — SODIUM CHLORIDE 0.9 % IV SOLN
1.0000 mg/h | INTRAVENOUS | Status: DC
  Administered 2013-06-20: 2 mg/h via INTRAVENOUS
  Filled 2013-06-20: qty 10

## 2013-06-20 MED ORDER — SODIUM CHLORIDE 0.9 % IV SOLN
25.0000 ug/h | INTRAVENOUS | Status: DC
  Administered 2013-06-20: 50 ug/h via INTRAVENOUS
  Filled 2013-06-20: qty 50

## 2013-06-20 MED ORDER — ALPRAZOLAM 0.5 MG PO TABS: 0.5000 mg | ORAL_TABLET | Freq: Every evening | ORAL | Status: DC | PRN

## 2013-06-20 MED ORDER — FAMOTIDINE IN NACL 20-0.9 MG/50ML-% IV SOLN
20.0000 mg | Freq: Two times a day (BID) | INTRAVENOUS | Status: DC
  Administered 2013-06-20 – 2013-06-21 (×4): 20 mg via INTRAVENOUS
  Filled 2013-06-20 (×6): qty 50

## 2013-06-20 MED ORDER — PAROXETINE HCL 20 MG PO TABS
20.0000 mg | ORAL_TABLET | Freq: Every day | ORAL | Status: DC
  Administered 2013-06-20 – 2013-06-27 (×8): 20 mg via ORAL
  Filled 2013-06-20 (×8): qty 1

## 2013-06-20 MED ORDER — MENTHOL 3 MG MT LOZG: 1.0000 | LOZENGE | OROMUCOSAL | Status: DC | PRN

## 2013-06-20 NOTE — Procedures (Signed)
Extubation Procedure Note  Patient Details:   Name: CASIMIR BARCELLOS DOB: 1946/12/16 MRN: [id number]   Pt extubated to [facility] after successful SBT, C/PS 5/5.  Pt positive for audible cuff leak, able to vocalize and has strong cough.  Will continue to monitor.     Evaluation  O2 sats: stable throughout Complications: No apparent complications Patient did tolerate procedure well. Bilateral Breath Sounds: Clear   Yes  ,  Apple 06/20/2013, 9:20 AM

## 2013-06-20 NOTE — Clinical Documentation Improvement (Signed)
THIS DOCUMENT IS NOT A PERMANENT PART OF THE MEDICAL RECORD  Please update your documentation with the medical record to reflect your response to this query. If you need help knowing how to do this please call [phone].  06/20/13   Dear Dr.Wright,  In a better effort to capture your patient's severity of illness, reflect appropriate length of stay and utilization of resources, a review of the patient medical record has revealed the following indicators.   Based on your clinical judgment, please clarify and document in a progress note and/or discharge summary the clinical condition associated with the following supporting information: In responding to this query please exercise your independent judgment.  The fact that a query is asked, does not imply that any particular answer is desired or expected.  Dr. Delford Field!  Thank you for your documentation of the diagnosis delirium on 06/20/13. If possible, please help by adding the acuity and type of delirium to the diagnosis of delirium.   You may use possible, probable, or suspect with inpatient documentation. possible, probable, suspected diagnoses MUST be documented at the time of discharge  Possible Clinical Conditions?  - Acute ICU Delirium  - Acute Delirium due to _______  - ICU Delrium  - Delirium due to _______  - Other condition (please document in the progress notes and/or discharge summary)  - Cannot Clinically determine at this time      Reviewed:  no additional documentation provided  Thank You,  Saul Fordyce  Clinical Documentation Specialist: [phone] Health Information Management [place]

## 2013-06-20 NOTE — Anesthesia Postprocedure Evaluation (Signed)
  Anesthesia Post-op Note  Patient: Ryan Ibarra  Procedure(s) Performed: Procedure(s) with comments: POSTERIOR LUMBAR FUSION 3 LEVEL (N/A) - Posterior Lumbar Two-Five Interbody and Fusion with Interbody, Posterolateral Arthrodesis, and Posterior Segmental Instrumentation  Patient Location: NICU  Anesthesia Type:General  Level of Consciousness: sedated  Airway and Oxygen Therapy: Patient remains intubated per anesthesia plan and Patient placed on Ventilator (see vital sign flow sheet for setting)  Post-op Pain: none  Post-op Assessment: Post-op Vital signs reviewed, Patient's Cardiovascular Status Stable, Respiratory Function Stable, Patent Airway, No signs of Nausea or vomiting and Pain level controlled  Post-op Vital Signs: Reviewed and stable  Complications: No apparent anesthesia complications

## 2013-06-20 NOTE — Consult Note (Signed)
PULMONARY  / CRITICAL CARE MEDICINE  Name: Ryan Ibarra MRN: [id number] DOB: 03-Mar-1946    ADMISSION DATE:  06/19/2013 CONSULTATION DATE:  06/19/2013  REFERRING MD :  Franky Macho PRIMARY SERVICE: NSGY  CHIEF COMPLAINT:  Post op vent management  BRIEF PATIENT DESCRIPTION: 67 y/o male with HTN, COPD underwent a multi-level lumbar procedure on 7/2 for spondylolithesis and lumbar stenosis.  The case finished just after midnight on 7/3 and PCCM was consulted for post op vent management.  SIGNIFICANT EVENTS / STUDIES:  7/2 arthrodesis, lumbar decompression, fusion, pedicle screw fixation multiple lumbar levels  LINES / TUBES: 7/2 ETT >>  CULTURES:   ANTIBIOTICS: 7/2 cefazolin periop >>  HISTORY OF PRESENT ILLNESS:  67 y/o male with HTN underwent a multi-level lumbar procedure on 7/2 for spondylolithesis and lumbar stenosis.  The case finished just after midnight on 7/3 and PCCM was consulted for post op vent management. He had spinal stenosis in both the cervical and lumbar spin causing leg pain and eventually developed leg weakness and gait instability.  He underwent an elective procedure on 7/2.  In the OR his EBL was 2.6 L and he received 970 units of cell saver as well as 6 liters of crystalloid and 250cc of albumin.  He required neosynephrine at times throughout the procedure.  PAST MEDICAL HISTORY :  Past Medical History  Diagnosis Date  . Hypertension   . Anxiety   . History of kidney stones   . GERD (gastroesophageal reflux disease)   . Neuromuscular disorder     neuropathy    Past Surgical History  Procedure Laterality Date  . Hand / finger lesion excision Right 70's    ring finger rt reattached  . Tonsillectomy     Prior to Admission medications   Medication Sig Start Date End Date Taking? Authorizing Provider  ALPRAZolam Prudy Feeler) 0.5 MG tablet Take 0.5 mg by mouth at bedtime as needed for sleep.   Yes Historical Provider, MD  aspirin 81 MG tablet Take 81 mg by mouth  daily.   Yes Historical Provider, MD  gabapentin (NEURONTIN) 300 MG capsule Take 600-900 mg by mouth 3 (three) times daily.   Yes Historical Provider, MD  lisinopril (PRINIVIL,ZESTRIL) 20 MG tablet Take 20 mg by mouth daily.   Yes Historical Provider, MD  meloxicam (MOBIC) 15 MG tablet Take 15 mg by mouth at bedtime.   Yes Historical Provider, MD  Multiple Vitamin (MULTIVITAMIN WITH MINERALS) TABS Take 1 tablet by mouth daily.   Yes Historical Provider, MD  oxyCODONE-acetaminophen (PERCOCET) 10-325 MG per tablet Take 2 tablets by mouth every 4 (four) hours as needed for pain.   Yes Historical Provider, MD  PARoxetine (PAXIL) 20 MG tablet Take 20 mg by mouth every morning.   Yes Historical Provider, MD  ranitidine (ZANTAC) 150 MG tablet Take 150 mg by mouth 2 (two) times daily.   Yes Historical Provider, MD  simvastatin (ZOCOR) 40 MG tablet Take 40 mg by mouth every evening.   Yes Historical Provider, MD  traZODone (DESYREL) 50 MG tablet Take 150 mg by mouth at bedtime.   Yes Historical Provider, MD   No Known Allergies  FAMILY HISTORY:  History reviewed. No pertinent family history. SOCIAL HISTORY:  reports that he quit smoking about 3 years ago. His smoking use included Cigarettes. He has a 15 pack-year smoking history. He does not have any smokeless tobacco history on file. His alcohol and drug histories are not on file.  REVIEW OF SYSTEMS:  Cannot obtain due to intubation  SUBJECTIVE:   VITAL SIGNS: Temp:  [97.1 F (36.2 C)-98.5 F (36.9 C)] 98.5 F (36.9 C) (07/03 0100) Pulse Rate:  [62-76] 76 (07/03 0115) Resp:  [19-23] 19 (07/03 0115) BP: (65-128)/(38-107) 65/41 mmHg (07/03 0116) SpO2:  [97 %-100 %] 97 % (07/03 0115) FiO2 (%):  [50 %] 50 % (07/03 0050) Weight:  [78.8 kg (173 lb 11.6 oz)] 78.8 kg (173 lb 11.6 oz) (07/03 0100) HEMODYNAMICS:   VENTILATOR SETTINGS: Vent Mode:  [-] PRVC FiO2 (%):  [50 %] 50 % Set Rate:  [14 bmp] 14 bmp Vt Set:  [530 mL] 530 mL PEEP:  [5  cmH20] 5 cmH20 Plateau Pressure:  [14 cmH20] 14 cmH20 INTAKE / OUTPUT: Intake/Output     07/02 0701 - 07/03 0700   I.V. (mL/kg) 6200 (78.7)   Blood 970   IV Piggyback 250   Total Intake(mL/kg) 7420 (94.2)   Urine (mL/kg/hr) 950   Blood 2600   Total Output 3550   Net +3870         PHYSICAL EXAMINATION:  Gen: pale, sedated on vent HEENT: facial swelling noted, ETT in place PULM: CTA B CV: RRR, no mgr, no JVD AB: BS+, soft, nontender, no hsm Ext: warm, no edema, no clubbing, no cyanosis Derm: no rash or skin breakdown Neuro: sedated on vent, moving all four ext, does not follow commands   LABS:  Recent Labs Lab 06/19/13 2113  HGB 11.9*  NA 139  K 4.3  GLUCOSE 107*   No results found for this basename: GLUCAP,  in the last 168 hours  CXR: 6/25 EKG: NSR, no ST wave changes   ASSESSMENT / PLAN:  PULMONARY A: Post op respiratory failure COPD, not in exacerbation P:   -full vent support until 7/3 AM, then likely extubation -abg -cxr -scheduled and prn bronchodilators  CARDIOVASCULAR A: Post op hypotension, likely due to blood loss and propofol P:  -change fentanyl to propofol -tele -crystalloid now -stat CBC  RENAL A:  No acute issues P:   -monitor UOP -BMET now  GASTROINTESTINAL A:  GERD P:   -pepcid bid  HEMATOLOGIC A:  Intraoperative EBL 2.6 L P:  -stat CBC -Hgb transfusion goal is <7 or actively bleeding  INFECTIOUS A:  No acute issues P:     ENDOCRINE A:  No acute issues P:   -monitor UOP  NEUROLOGIC A:  Spinal stenosis, s/p lengthy procedure on 7/3 P:   -change out propofol for fentanyl -titrate sedation to RASS -1 -post op care per NSGY  TODAY'S SUMMARY: 67 y/o male s/p lengthy lumbar spinal procedure for spinal stenosis, brought to ICU intubated post op.  Hypotension likely related to propofol and blood loss.  Plan supportive care, likely extubation in AM 7/3.  I have personally obtained a history, examined the  patient, evaluated laboratory and imaging results, formulated the assessment and plan and placed orders. CRITICAL CARE: The patient is critically ill with multiple organ systems failure and requires high complexity decision making for assessment and support, frequent evaluation and titration of therapies, application of advanced monitoring technologies and extensive interpretation of multiple databases. Critical Care Time devoted to patient care services described in this note is 40 minutes.   Fonnie Jarvis Pulmonary and Critical Care Medicine [facility] Pager: [phone]  06/20/2013, 1:33 AM

## 2013-06-20 NOTE — Clinical Documentation Improvement (Signed)
THIS DOCUMENT IS NOT A PERMANENT PART OF THE MEDICAL RECORD  Please update your documentation with the medical record to reflect your response to this query. If you need help knowing how to do this please call [phone].  06/20/13  Dear Dr. Franky Macho,  In an effort to better capture your patient's severity of illness, reflect appropriate length of stay and utilization of resources, a review of the patient medical record has revealed the following indicators.   Based on your clinical judgment, please clarify and document in a progress note and/or discharge summary the clinical condition associated with the following supporting information: In responding to this query please exercise your independent judgment.  The fact that a query is asked, does not imply that any particular answer is desired or expected.   Hello Dr. Franky Macho!  Abnormal findings (laboratory, x-ray, MRI/CT scans, and other diagnostic results) are not coded and reported unless the physician indicates their clinical significance.   The medical record reflects the following clinical findings, please clarify the diagnostic and/or clinical significance:   Possible Clinical Conditions?  - Expected Acute Blood Loss Anemia  - Acute Blood Loss Anemia  - Other condition (please document in the progress notes and/or discharge summary)  - Cannot Clinically determine at this time   Supporting Information:  - Surgical EBL =  - Intraoperative Cell Saver given =  - "Post op hypotension, likely due to blood loss and propofol >> imrpoved." progress note 7/2.  Component Hemoglobin HCT  Latest Ref Rng 13.0 - 17.0 g/dL 09.8 - 11.9 %  1/47/8295 15.1 41.4  06/19/2013 11.9 (L) 35.0 (L)  06/20/2013 12.3 (L) 35.0 (L)        Thank You!  Saul Fordyce  Clinical Documentation Specialist: [phone] Health Information Management [place]   This is not my patient  Shan Levans

## 2013-06-20 NOTE — Progress Notes (Signed)
Patient ID: Ryan Ibarra, male   DOB: 17-Feb-1946, 67 y.o.   MRN: [id number] BP 114/62  Pulse 79  Temp(Src) 99 F (37.2 C) (Oral)  Resp 18  Ht 5\' 7"  (1.702 m)  Wt 78.8 kg (173 lb 11.6 oz)  BMI 27.2 kg/m2  SpO2 94% Alert and oriented x 4, speech is clear and fluent Moving lower extremities well Will transfer to floor.  Wound is clean, no signs of infection.

## 2013-06-20 NOTE — Evaluation (Signed)
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  PMH positive for hypertension, COPD, cervical degenerative disease, lumbar degenerative disease. He has peripheral neuropathy; etiology is unknown.  Clinical Impression  Patient presents with deficits listed below and hope that although did not tolerate mobility well today he will progress to d/c home with HHPT and family assist.  Patient in surgery till late and just recently extubated.  Will follow acutely.    PT Assessment  Patient needs continued PT services    Follow Up Recommendations  Home health PT;Supervision/Assistance - 24 hour    Does the patient have the potential to tolerate intense rehabilitation    N/A  Barriers to Discharge  Inaccessible home enviroment      Equipment Recommendations  Rolling walker with 5" wheels    Recommendations for Other Services   None  Frequency Min 5X/week    Precautions / Restrictions Precautions Precautions: Back Precaution Booklet Issued: No Precaution Comments: verbally educated in precautions Required Braces or Orthoses: Spinal Brace Restrictions Other Position/Activity Restrictions: RN spoke with MD who states will order brace, but okay for therapy today without brace   Pertinent Vitals/Pain Mod to severe back pain with mobility, RN delivered pain meds in session      Mobility  Bed Mobility Bed Mobility: Rolling Right;Right Sidelying to Sit;Sit to Sidelying Right;Sitting - Scoot to Edge of Bed Rolling Right: With rail;1: +2 Total assist Rolling Right: Patient Percentage: 30% Right Sidelying to Sit: 1: +2 Total assist Right  Sidelying to Sit: Patient Percentage: 30% Sitting - Scoot to Edge of Bed: 2: Max assist (but pt did not tolerate) Sit to Sidelying Right: 1: +2 Total assist;With rail;HOB flat Sit to Sidelying Right: Patient Percentage: 30% Details for Bed Mobility Assistance: able to manage trunk and upper body better than LE's.  Had palpable muscle fasciculations in hamstring tendons Transfers Transfers: Not assessed Ambulation/Gait Ambulation/Gait Assistance: Not tested (comment)        PT Diagnosis: Acute pain;Difficulty walking  PT Problem List: Decreased strength;Decreased activity tolerance;Decreased balance;Decreased mobility;Pain;Decreased knowledge of use of DME;Decreased knowledge of precautions PT Treatment Interventions: DME instruction;Balance training;Gait training;Stair training;Functional mobility training;Patient/family education;Therapeutic activities;Therapeutic exercise     PT Goals(Current goals can be found in the care plan section) Acute Rehab PT Goals PT Goal Formulation: With patient Time For Goal Achievement: 06/27/13 Potential to Achieve Goals: Good  Visit Information  Last PT Received On: 06/20/13 Assistance Needed: +2 PT/OT Co-Evaluation/Treatment: Yes History of Present Illness: Patient is a 67 y/o male 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  PMH positive for hypertension, COPD, cervical degenerative disease, lumbar degenerative disease. He has peripheral neuropathy; etiology is unknown.       Prior Functioning  Home Living Family/patient expects to be discharged to:: Private residence Living Arrangements: Spouse/significant other Available Help at Discharge: Family Type of Home: House Home Access: Stairs to enter [employer] of Steps: 3 (shallow steps) Entrance Stairs-Rails: None Home Layout: Two level;Bed/bath upstairs Alternate Level Stairs-Number of  Steps: 17 Alternate Level Stairs-Rails: Right Home Equipment: None Prior Function Level of Independence: Independent Communication Communication: No difficulties    Cognition  Cognition Arousal/Alertness:  Awake/alert Behavior During Therapy: WFL for tasks assessed/performed Overall Cognitive Status: Within Functional Limits for tasks assessed    Extremity/Trunk Assessment Upper Extremity Assessment Upper Extremity Assessment: Defer to OT evaluation Lower Extremity Assessment Lower Extremity Assessment: LLE deficits/detail;RLE deficits/detail RLE Deficits / Details: reports unable to bend knee independent, but too painful for PROM due to muscle spasms in legs RLE: Unable to fully assess due to pain RLE Sensation: history of peripheral neuropathy (decreased to mid calf) LLE Deficits / Details: AAROM hip and knee flexion in supine approx 30 degrees with pain LLE: Unable to fully assess due to pain LLE Sensation: history of peripheral neuropathy (decreased to mid calf)   Balance Balance Balance Assessed: Yes Static Sitting Balance Static Sitting - Balance Support: Bilateral upper extremity supported;Feet unsupported Static Sitting - Level of Assistance: 3: Mod assist;2: Max assist Static Sitting - Comment/# of Minutes: 8  End of Session PT - End of Session Activity Tolerance: Patient limited by pain Patient left: in bed;with call bell/phone within reach Nurse Communication: Mobility status  GP     [facility] 06/20/2013, 11:10 AM  Sheran Lawless, PT [phone] 06/20/2013

## 2013-06-20 NOTE — Clinical Social Work Note (Signed)
Clinical Social Worker received standing order referral for possible ST-SNF placement.  Chart reviewed.  PT/OT recommending home with home health.  Patient lives at home with his wife who is able to provide 24 hour supervision.  Spoke with RN Case Manager who will follow up with patient to discuss home health needs.    CSW signing off - please re consult if social work needs arise.  Macario Golds, [place] [phone]

## 2013-06-20 NOTE — Op Note (Signed)
06/19/2013 - 06/20/2013  1:03 AM  PATIENT:  Ryan Ibarra  67 y.o. 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  [employer]onic interbody Peek cages 10mm x75mm , 6 total, morselized local autograft Posterolateral arthrodesis L2-5 Segmental pedicle screw fixation L2-5, Medtronic solera instrumentation  SURGEON:  Surgeon(s): Ryan Hurt, MD Ryan Shorts, MD  ASSISTANTS:nudelman  ANESTHESIA:   general  EBL:  Total I/O In: 4600 [I.V.:3800; Blood:800] Out: 2170 [Urine:470; Blood:1700]  BLOOD ADMINISTERED:970 CC CELLSAVER  CELL SAVER GIVEN:yes  COUNT:per nursing  DRAINS: (medium) Hemovact drain(s) in the subfascial space with  Suction Open   SPECIMEN:  No Specimen  DICTATION: Ryan Ibarra was brought to the operating room, intubated, and placed under a general anesthetic without diffculty. He had a foley catheter placed under sterile conditions. He was positioned prone on a Jackson table with all pressure points properly padded. His back was prepped and draped in a sterile fashion. I opened the incision with a 10 blade and dissected to the thoracolumbar fascia. I exposed the lamina of L1-L5 bilaterally. I confirmed my location with an intraoperative xray. I started my decompression at this time. I performed complete laminectomies of L2,3, and L4 along with inferior facetectomies of L2,3, and L4 bilaterally in order to decompress the L2,3,4, and L5 roots. I used the [employer], [employer] punches, and the drill to accomplish the decompression. He had a great deal of facet hypertrophy along with ligamentus  hypertrophy causing canal stenosis. I completely decompressed the spinal canal and thecal sac from L2-L5. I prepared for the PLIF by performing discetomies bilaterally at each operated level. There were calcified disc herniations at each level. I was able to distract the disc spaces with special distractors. I used disc space shavers and curettes to prepare the disc spaces to accept the cages. All three spaces were cleaned of disc, and endplate cartilage. I measured the spaces and used 10 mm cages at each level. I used a 6 degree cage at L3/4. The cages were filled with local morselized autograft, and more graft was packed lateral to the cages.  I exposed the transverse processes bilaterally from L2-L5 after the decompression. I decorticated them then placed more local autograft in the lateral gutters to complete the posterolateral arthrodesis. I placed 8 pedicle screws of various length, all 6.13mm solera screws, 2 in each vertebral body from L2-5 inclusive. I used fluoroscopic guidance to place the screws. I drilled a pilot hole, then sounded each pedicle with a probe. I tapped each hole with a 5.76mm tap, assessed the integrity, then placed the screws with Dr. Earl Ibarra assistance. The pedicle screws were checked with fluoroscopy, and all were in good position. Rods were placed in the screw heads and secured with locking caps. I also placed with Dr. Lujean Ibarra assistance , two cross links. I irrigated the wound, then we closed in layers. I approximated the thoracolumbar fascia, subcutaneous, and subcuticular tissue. i used dermabond for a sterile dressing. I placed a medium hemovac drain in the subfascial plane.    PLAN OF CARE: Admit to inpatient   PATIENT DISPOSITION:  ICU - intubated and hemodynamically stable.   Delay start of Pharmacological VTE agent (>24hrs) due to surgical blood loss or  risk of bleeding:  yes

## 2013-06-20 NOTE — Transfer of Care (Signed)
Immediate Anesthesia Transfer of Care Note  Patient: Ryan Ibarra  Procedure(s) Performed: Procedure(s) with comments: POSTERIOR LUMBAR FUSION 3 LEVEL (N/A) - Posterior Lumbar Two-Five Interbody and Fusion with Interbody, Posterolateral Arthrodesis, and Posterior Segmental Instrumentation  Patient Location: NICU  Anesthesia Type:General  Level of Consciousness: Patient remains intubated per anesthesia plan  Airway & Oxygen Therapy: Patient remains intubated per anesthesia plan and Patient placed on Ventilator (see vital sign flow sheet for setting)  Post-op Assessment: Report given to PACU RN and Post -op Vital signs reviewed and stable  Post vital signs: Reviewed and stable  Complications: No apparent anesthesia complications

## 2013-06-20 NOTE — Evaluation (Signed)
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  PMH positive for hypertension, COPD, cervical degenerative disease, lumbar degenerative disease. He has peripheral neuropathy; etiology is unknown.   Clinical Impression   Will continue to follow pt acutely in order to address below problem list.  Anticipate that pt will progress to be appropriate for d/c home with family (today pt limited by fatigue from being in sx late and recently extubated).      OT Assessment  Patient needs continued OT Services    Follow Up Recommendations  Home health OT;Supervision/Assistance - 24 hour    Barriers to Discharge      Equipment Recommendations   (TBD)    Recommendations for Other Services    Frequency  Min 2X/week    Precautions / Restrictions Precautions Precautions: Back Precaution Booklet Issued: No Precaution Comments: verbally educated in precautions Required Braces or Orthoses: Spinal Brace Restrictions Other Position/Activity Restrictions: RN spoke with MD who states will order brace, but okay for therapy today without brace   Pertinent Vitals/Pain See vitals    ADL  Eating/Feeding: Performed;Set up Where Assessed - Eating/Feeding: Bed level Grooming: Performed;Wash/dry face;Set up Where Assessed - Grooming: Supine, head of bed up Upper Body Bathing: Simulated;Maximal assistance Where Assessed - Upper Body Bathing: Supported sitting Lower Body Bathing: Simulated;Maximal assistance Where Assessed - Lower Body Bathing: Supported sitting Upper Body Dressing: Simulated;Maximal assistance Where Assessed - Upper Body Dressing:  Supported sitting Lower Body Dressing: Performed;+1 Total assistance Where Assessed - Lower Body Dressing: Supported sitting Transfers/Ambulation Related to ADLs: Pt sat EOB ~8 minutes with mod-max assist. ADL Comments: Pt limited by pain and fatigue (in sx til late last night and just recently extubated).    OT Diagnosis: Generalized weakness;Acute pain  OT Problem List: Decreased strength;Decreased activity tolerance;Impaired balance (sitting and/or standing);Decreased knowledge of use of DME or AE;Decreased knowledge of precautions;Pain OT Treatment Interventions: Self-care/ADL training;DME and/or AE instruction;Therapeutic activities;Patient/family education;Balance training   OT Goals(Current goals can be found in the care plan section) Acute Rehab OT Goals OT Goal Formulation: With patient Time For Goal Achievement: 07/04/13 Potential to Achieve Goals: Good  Visit Information  Last OT Received On: 06/20/13 Assistance Needed: +2 PT/OT Co-Evaluation/Treatment: Yes History of Present Illness: Patient is a 67 y/o male 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  PMH positive for hypertension, COPD, cervical degenerative disease, lumbar degenerative disease. He has peripheral neuropathy; etiology is unknown.       Prior Functioning     Home Living Family/patient expects to be discharged to:: Private residence Living Arrangements: Spouse/significant other Available Help at Discharge: Family Type of Home: House Home Access: Stairs to enter [employer] of Steps: 3 (shallow steps) Entrance Stairs-Rails: None Home Layout: Two level;Bed/bath upstairs Alternate Level Stairs-Number of Steps: 17 Alternate Level Stairs-Rails: Right Home Equipment: None Prior Function Level of Independence: Independent Communication Communication: No difficulties         Vision/Perception      Cognition  Cognition Arousal/Alertness: Awake/alert Behavior During Therapy: WFL for tasks assessed/performed Overall Cognitive Status: Within Functional Limits for tasks assessed    Extremity/Trunk Assessment Upper Extremity Assessment Upper Extremity Assessment: Overall  WFL for tasks assessed Lower Extremity Assessment Lower Extremity Assessment: LLE deficits/detail;RLE deficits/detail RLE Deficits / Details: reports unable to bend knee independent, but too painful for PROM due to muscle spasms in legs RLE: Unable to fully assess due to pain RLE Sensation: history of peripheral neuropathy (decreased to mid calf) LLE Deficits / Details: AAROM hip and knee flexion in supine approx 30 degrees with pain LLE: Unable to fully assess due to pain LLE Sensation: history of peripheral neuropathy (decreased to mid calf)     Mobility Bed Mobility Bed Mobility: Rolling Right;Right Sidelying to Sit;Sit to Sidelying Right;Sitting - Scoot to Edge of Bed Rolling Right: With rail;1: +2 Total assist Rolling Right: Patient Percentage: 30% Right Sidelying to Sit: 1: +2 Total assist Right Sidelying to Sit: Patient Percentage: 30% Sitting - Scoot to Edge of Bed: 2: Max assist Sit to Sidelying Right: 1: +2 Total assist;With rail;HOB flat Sit to Sidelying Right: Patient Percentage: 30% Details for Bed Mobility Assistance: able to manage trunk and upper body better than LE's.  Had palpable muscle fasciculations in hamstring tendons     Exercise     Balance Balance Balance Assessed: Yes Static Sitting Balance Static Sitting - Balance Support: Bilateral upper extremity supported;Feet unsupported Static Sitting - Level of Assistance: 3: Mod assist;2: Max assist Static Sitting - Comment/# of Minutes: 8 minutes   End of Session OT - End of Session Activity Tolerance: Patient limited by fatigue;Patient limited by pain Patient left: in bed;with call bell/phone within reach Nurse Communication: Mobility  status  GO    06/20/2013 Cipriano Mile OTR/L Pager [phone] Office [phone]  Cipriano Mile 06/20/2013, 11:30 AM

## 2013-06-20 NOTE — Progress Notes (Signed)
PULMONARY  / CRITICAL CARE MEDICINE  Name: Ryan Ibarra MRN: [id number] DOB: 14-Feb-1946    ADMISSION DATE:  06/19/2013 CONSULTATION DATE:  06/19/2013  REFERRING MD :  Franky Macho PRIMARY SERVICE: NSGY  CHIEF COMPLAINT:  Post op vent management  BRIEF PATIENT DESCRIPTION: 67 y/o male with HTN, COPD underwent a multi-level lumbar procedure on 7/2 for spondylolithesis and lumbar stenosis.  The case finished just after midnight on 7/3 and PCCM was consulted for post op vent management.  SIGNIFICANT EVENTS / STUDIES:  7/2 arthrodesis, lumbar decompression, fusion, pedicle screw fixation multiple lumbar levels  LINES / TUBES: 7/2 ETT >> 7/02  CULTURES:   ANTIBIOTICS: 7/2 cefazolin >>  SUBJECTIVE:  Tolerating pressure support.  VITAL SIGNS: Temp:  [97.1 F (36.2 C)-98.6 F (37 C)] 98.6 F (37 C) (07/03 0810) Pulse Rate:  [62-84] 82 (07/03 0727) Resp:  [14-23] 14 (07/03 0727) BP: (65-128)/(38-107) 102/63 mmHg (07/03 0727) SpO2:  [96 %-100 %] 100 % (07/03 0841) FiO2 (%):  [40 %-50 %] 40 % (07/03 0841) Weight:  [173 lb 11.6 oz (78.8 kg)] 173 lb 11.6 oz (78.8 kg) (07/03 0100) VENTILATOR SETTINGS: Vent Mode:  [-] PSV;CPAP FiO2 (%):  [40 %-50 %] 40 % Set Rate:  [14 bmp] 14 bmp Vt Set:  [530 mL] 530 mL PEEP:  [5 cmH20] 5 cmH20 Pressure Support:  [5 cmH20] 5 cmH20 Plateau Pressure:  [8 cmH20-14 cmH20] 8 cmH20 INTAKE / OUTPUT: Intake/Output     07/02 0701 - 07/03 0700 07/03 0701 - 07/04 0700   I.V. (mL/kg) 6834 (86.7)    Blood 970    IV Piggyback 1100    Total Intake(mL/kg) 8904 (113)    Urine (mL/kg/hr) 1255    Drains 90    Blood 2600    Total Output 3945     Net +4959            PHYSICAL EXAMINATION:  Gen: No distress HEENT: ETT in place PULM: no wheeze CV: regular AB: soft, non tender Ext: no edema Derm: no rash or skin breakdown Neuro: alert, follows commands, moves all extremities   LABS: CBC Recent Labs     06/19/13  2113  06/20/13  0300  WBC   --    14.5*  HGB  11.9*  12.3*  HCT  35.0*  35.0*  PLT   --   128*    Coag's No results found for this basename: APTT, INR,  in the last 72 hours  BMET Recent Labs     06/19/13  2113  06/20/13  0300  NA  139  138  K  4.3  4.6  CL   --   107  CO2   --   23  BUN   --   15  CREATININE   --   1.01  GLUCOSE  107*  137*    Electrolytes Recent Labs     06/20/13  0300  CALCIUM  7.4*    Sepsis Markers No results found for this basename: LACTICACIDVEN, PROCALCITON, O2SATVEN,  in the last 72 hours  ABG Recent Labs     06/20/13  0225  PHART  7.390  PCO2ART  38.8  PO2ART  97.0    Liver Enzymes No results found for this basename: AST, ALT, ALKPHOS, BILITOT, ALBUMIN,  in the last 72 hours  Cardiac Enzymes No results found for this basename: TROPONINI, PROBNP,  in the last 72 hours  Glucose No results found for this basename: GLUCAP,  in the  last 72 hours  Imaging Dg Lumbar Spine 2-3 Views  06/20/2013   *RADIOLOGY REPORT*  Clinical Data: Two through L5 posterior lumbar interbody fusion.  LUMBAR SPINE - 2-3 VIEW  Comparison: None.  Findings: Intraoperative fluoroscopic films demonstrate rod and pedicle screw fixation from L2-L5 with discectomy at same levels. Soft tissue retractors are present dorsally.  On the frontal view, there appears be wide laminectomy at these levels.  IMPRESSION:  L2-L5 posterior lumbar interbody fusion.   Original Report Authenticated By: Andreas Newport, M.D.   Dg Lumbar Spine 2-3 Views  06/19/2013   *RADIOLOGY REPORT*  Clinical Data: PLIF  LUMBAR SPINE - 2-3 VIEW  Comparison: MRI 11/20/2012  Findings: Initial film shows a needle overlying the spinous process of L2.  Second film shows a probe directed between the spinous processes of L2 and L3, towards the L2-3 disc space.  IMPRESSION: The L2-3 disc space localized.   Original Report Authenticated By: Paulina Fusi, M.D.   Dg Chest Port 1 View  06/20/2013   *RADIOLOGY REPORT*  Clinical Data: Intubated.   Respiratory failure.  PORTABLE CHEST - 1 VIEW  Comparison: 06/12/2013.  Findings: Lower lung volumes are present.  There is a new endotracheal tube with the tip 56 mm from the carina.  Lower volumes accentuate the pulmonary vasculature.  No airspace disease/consolidation.  Subsegmental atelectasis over the left hemidiaphragm.  Gaseous distention of the stomach.  IMPRESSION:  1.  Endotracheal tube in good position, 56 mm from the carina. 2.  Gaseous distention of the stomach.  Consider enteric tube decompression. 3.  Low volume chest.   Original Report Authenticated By: Andreas Newport, M.D.     ASSESSMENT / PLAN:  PULMONARY A: Post op respiratory failure COPD, not in exacerbation P:   -proceed with extubation -prn BD's -f/u CXR as needed -oxygen to keep SpO2 > 92% -bronchial hygiene  CARDIOVASCULAR A: Post op hypotension, likely due to blood loss and propofol >> imrpoved. P:  -hold outpt lisinipril  RENAL A:  No acute issues P:   -monitor renal fx, urine outpt, electrolytes  GASTROINTESTINAL A:  GERD P:   -pepcid bid -advance diet after extubation  HEMATOLOGIC A:  Intraoperative EBL 2.6 L P:  -f/u CBC -Hgb transfusion goal is <7 or actively bleeding  INFECTIOUS A:  No acute issues P:   -surgical prophylaxis with ancef per neurosurgery  ENDOCRINE A:  No acute issues P:   -monitor blood sugar on BMET  NEUROLOGIC A:  Spinal stenosis, s/p lengthy procedure on 7/3 P:   -post op care, pain control per NSGY  CC time 40 minutes.  Coralyn Helling, MD [facility] Pulmonary/Critical Care 06/20/2013, 8:49 AM Pager:  [phone] After 3pm call: [phone]

## 2013-06-20 NOTE — Progress Notes (Signed)
UR COMPLETED  

## 2013-06-20 NOTE — Progress Notes (Signed)
eLink Physician-Brief Progress Note Patient Name: Ryan Ibarra DOB: 1946-09-10 MRN: [id number]  Date of Service  06/20/2013   HPI/Events of Note   Agitation, cannot tolerate propofol d/t bp low  eICU Interventions  Try to add versed to fentanyl, should be able to extubate this am later    Intervention Category Major Interventions: Delirium, psychosis, severe agitation - evaluation and management  Shan Levans 06/20/2013, 3:52 AM

## 2013-06-20 NOTE — Progress Notes (Signed)
Received patient from OR. Patient placed on ventilator.

## 2013-06-20 NOTE — Progress Notes (Signed)
eLink Physician-Brief Progress Note Patient Name: Ryan Ibarra DOB: 19-Mar-1946 MRN: [id number]  Date of Service  06/20/2013   HPI/Events of Note  Pt hypotensive and on vent   eICU Interventions  See orders, full pccm note to follow   Intervention Category Major Interventions: Hypotension - evaluation and management;Respiratory failure - evaluation and management  Shan Levans 06/20/2013, 1:20 AM

## 2013-06-21 DIAGNOSIS — J95821 Acute postprocedural respiratory failure: Secondary | ICD-10-CM

## 2013-06-21 DIAGNOSIS — Z981 Arthrodesis status: Secondary | ICD-10-CM

## 2013-06-21 LAB — CBC
HCT: 28.8 % — ABNORMAL LOW (ref 39.0–52.0)
MCHC: 35.8 g/dL (ref 30.0–36.0)
MCV: 93.5 fL (ref 78.0–100.0)
Platelets: 121 10*3/uL — ABNORMAL LOW (ref 150–400)
RDW: 12.5 % (ref 11.5–15.5)
WBC: 10.6 10*3/uL — ABNORMAL HIGH (ref 4.0–10.5)

## 2013-06-21 LAB — BASIC METABOLIC PANEL
BUN: 15 mg/dL (ref 6–23)
Calcium: 7.7 mg/dL — ABNORMAL LOW (ref 8.4–10.5)
Chloride: 105 mEq/L (ref 96–112)
Creatinine, Ser: 0.9 mg/dL (ref 0.50–1.35)
GFR calc Af Amer: >90 mL/min (ref >90)

## 2013-06-21 MED ORDER — FAMOTIDINE 20 MG PO TABS
20.0000 mg | ORAL_TABLET | Freq: Two times a day (BID) | ORAL | Status: DC
  Administered 2013-06-21 – 2013-06-24 (×7): 20 mg via ORAL
  Filled 2013-06-21 (×9): qty 1

## 2013-06-21 NOTE — Progress Notes (Signed)
Transferred patient via bed to 4N on tele.  Patient left in bed with Lesly Dukes, RN and NT at bedside. ,  C

## 2013-06-21 NOTE — Progress Notes (Signed)
Orthopedic Tech Progress Note Patient Details:  Ryan Ibarra [birth year] [id number]  Patient ID: Ryan Ibarra, male   DOB: 1946-06-02, 67 y.o.   MRN: [id number]   [name] 06/21/2013, 10:48 AMLumbar corset completed by bio-tech.

## 2013-06-21 NOTE — Progress Notes (Signed)
PT Cancellation Note  Patient Details Name: Ryan Ibarra MRN: [id number] DOB: 1946/05/07   Cancelled Treatment:    Reason Eval/Treat Not Completed: Pain limiting ability to participate.  Attempted to see patient x3 today.  Patient c/o increased pain.  Per RN has not been using PCA - encouraged use to help control pain.  Patient states "I'm not getting up today"  Will return tomorrow.   Vena Austria 06/21/2013, 1:53 PM Durenda Hurt. Renaldo Fiddler, [facility] Acute Rehab Services Pager [phone]

## 2013-06-21 NOTE — Progress Notes (Signed)
Pt refused his dinner this evening, saying "I,m doing that to protest about getting my pain under control".

## 2013-06-21 NOTE — Progress Notes (Signed)
PULMONARY  / CRITICAL CARE MEDICINE  Name: Ryan Ibarra MRN: [id number] DOB: 1946/07/20    ADMISSION DATE:  06/19/2013 CONSULTATION DATE:  06/19/2013  REFERRING MD :  Franky Macho PRIMARY SERVICE: NSGY  CHIEF COMPLAINT:  Post op vent management  BRIEF PATIENT DESCRIPTION: 67 y/o male with HTN, COPD underwent a multi-level lumbar procedure on 7/2 for spondylolithesis and lumbar stenosis.  The case finished just after midnight on 7/3 and PCCM was consulted for post op vent management.  SIGNIFICANT EVENTS / STUDIES:  7/2 arthrodesis, lumbar decompression, fusion, pedicle screw fixation multiple lumbar levels  LINES / TUBES: 7/2 ETT >> 7/02  CULTURES:   ANTIBIOTICS: 7/2 cefazolin >>off  SUBJECTIVE:    VITAL SIGNS: Temp:  [97.8 F (36.6 C)-99.6 F (37.6 C)] 99.6 F (37.6 C) (07/04 1326) Pulse Rate:  [78-91] 91 (07/04 1326) Resp:  [14-22] 20 (07/04 1326) BP: (102-139)/(58-76) 139/76 mmHg (07/04 1326) SpO2:  [91 %-100 %] 100 % (07/04 1326) Weight:  [81.6 kg (179 lb 14.3 oz)] 81.6 kg (179 lb 14.3 oz) (07/04 0145) Room air     INTAKE / OUTPUT: Intake/Output     07/03 0701 - 07/04 0700 07/04 0701 - 07/05 0700   I.V. (mL/kg) 1611 (19.7)    Blood     IV Piggyback 100    Total Intake(mL/kg) 1711 (21)    Urine (mL/kg/hr) 1325 (0.7) 250 (0.5)   Drains 580 (0.3)    Blood     Total Output 1905 250   Net -194 -250        Urine Occurrence  1 x     PHYSICAL EXAMINATION:  Gen: No distress HEENT:[place], no JVD, some residual peri-orbital edema PULM: no wheeze CV: regular AB: soft, non tender Ext: no edema Derm: no rash or skin breakdown Neuro: alert, follows commands, moves all extremities   LABS: CBC Recent Labs     06/19/13  2113  06/20/13  0300  WBC   --   14.5*  HGB  11.9*  12.3*  HCT  35.0*  35.0*  PLT   --   128*    Coag's No results found for this basename: APTT, INR,  in the last 72 hours  BMET Recent Labs     06/19/13  2113  06/20/13  0300  NA  139  138   K  4.3  4.6  CL   --   107  CO2   --   23  BUN   --   15  CREATININE   --   1.01  GLUCOSE  107*  137*   ASSESSMENT / PLAN:  Post op respiratory failure--->resolved  COPD, not in exacerbation Extubated 7/3 w/out difficulty.  P:   -prn BD's -f/u CXR as needed -oxygen to keep SpO2 > 92% -bronchial hygiene  Post op hypotension, likely due to blood loss and propofol, resolved BP trending up P:  Resume home antihypertensives  GERD P:   -pepcid bid -advance diet   Intraoperative EBL 2.6 L/ ABL anemia H&h stable P:  -f/u CBC -Hgb transfusion goal is <7 or actively bleeding   A:  Spinal stenosis, s/p lengthy procedure on 7/3 still has post-op pain and residual LE weakness P:   -post op care, pain control per NSGY -need PT/Ot have encouraged this  Advancing well. We will s/o, call PRN  Levy Pupa, MD, PhD 06/21/2013, 2:53 PM [facility] [phone] or if no answer [phone]  .

## 2013-06-21 NOTE — Progress Notes (Signed)
Filed Vitals:   06/21/13 0145 06/21/13 0400 06/21/13 0553 06/21/13 0605  BP: 125/63  119/70   Pulse: 85  83   Temp: 97.8 F (36.6 C)  98.9 F (37.2 C)   TempSrc: Oral  Oral   Resp: 18 20 22 18   Height: 5\' 11"  (1.803 m)     Weight: 81.6 kg (179 lb 14.3 oz)     SpO2: 93% 91% 95% 95%    CBC  Recent Labs  06/19/13 2113 06/20/13 0300  WBC  --  14.5*  HGB 11.9* 12.3*  HCT 35.0* 35.0*  PLT  --  128*   BMET  Recent Labs  06/19/13 2113 06/20/13 0300  NA 139 138  K 4.3 4.6  CL  --  107  CO2  --  23  GLUCOSE 107* 137*  BUN  --  15  CREATININE  --  1.01  CALCIUM  --  7.4*    Patient with significant incisional pain as well as discomfort into the lower extremities. Continues on Dilaudid PCA. Nursing staff on [address] has ordered the patient lumbar corset. To clarify, the patient's ICU nurse yesterday, Cassie, confirmed that Dr. Franky Macho did tell her that he was going to order a lumbar brace for the patient. I explained to patient that Dr. Franky Macho does want him to use a brace. We'll continue PT and OT, will continue PCA until patient more comfortable.  Plan: Continue progress to postoperative recovery.  Hewitt Shorts, MD 06/21/2013, 9:28 AM

## 2013-06-21 NOTE — Progress Notes (Signed)
OT Cancellation Note  Patient Details Name: Ryan Ibarra MRN: [id number] DOB: [birth year]   Cancelled Treatment:    Reason Eval/Treat Not Completed: Patient declined, no reason specified.  Will reattempt  , Ursula Alert M 06/21/2013, 5:40 PM

## 2013-06-21 NOTE — Progress Notes (Signed)
Patient looks stable in bed with first impression but when attempted to get him up, he starts to play around and have an excuse of why he does not want to get up. Rn has made all the available resources for him to get out of bed but all prove futile. He is refusing to eat because he said he cannot eat in bed, and when PT had come in to evaluate him, he refused saying "I am not getting up today unless the anesthetiologist comes by to make me sleep before you can get me up." well pt has been educated on the need of getting up early after surgery. Will monitor him closely.

## 2013-06-21 NOTE — Progress Notes (Signed)
RN attempted to get patient out of bed after the brace was given to him, but unsuccessful, pt refused for 3 times, charge nurse aware. Will continue to monitor him and try again later.

## 2013-06-21 NOTE — Progress Notes (Signed)
Pt requested to see CN.  Pt had many complaints of pain since surgery.  Arrived on [facility] at 1:54 am today from ICU.  He continues on dilaudid PCA and prn medications as ordered.  He agreed to try valium at next medication time to see if it produces any relief.  We repositioned him with pillows and states more comfortable.  RN ordered brace from [employer].  Dr. Jule Ser has been in to see patient today.  Will continue to monitor pain.

## 2013-06-22 NOTE — Progress Notes (Signed)
OT Cancellation Note  Patient Details Name: Ryan Ibarra MRN: [id number] DOB: Feb 02, 1946   Cancelled Treatment:    Reason Eval/Treat Not Completed: Patient declined, no reason specified.  Explained the benefits of OOB activity and the scope of OT.  Agreed to work with therapies tomorrow.  Evern Bio 06/22/2013, 4:26 PM

## 2013-06-22 NOTE — Progress Notes (Signed)
Patient ID: Ryan Ibarra, male   DOB: 1946-02-04, 67 y.o.   MRN: [id number] Still having incisional pain but able to move legs with less pain. Vs wnl. Decrease of drainage . hemovac to be removed

## 2013-06-22 NOTE — Progress Notes (Signed)
Physical Therapy Treatment Patient Details Name: Ryan Ibarra MRN: [id number] DOB: [birth year] Today's Date: 06/22/2013 Time: 3086-5784 PT Time Calculation (min): 29 min  PT Assessment / Plan / Recommendation  PT Comments   Pt agreeable to participate with therapy today. Nursing present for entire session and pulled lumbar drain when pt laying on his side.  Pt does not want to be pulled or "yanked" and wants to do all that he can for himself.  Pt moves very slowly and asks lots of questions regarding proper movement.  When sitting EOB pt reported he felt dizzy and nauseated. Sat EOB for about 10 minutes total.  Stated headache was getting better as he sat longer. Pt did not want to attempt to get to chair today.  Stated tomorrow's goal would be to get out of bed. Pt did ask what he needed to be able to do to go home and also stated he was not going to go to a nursing home. Explained to pt that from a mobility standpoint he needed to be able to get out of bed, up from chairs, and walking. Pt wants to do things himself and moves very slowly therefore increased time required to mobilize.    Follow Up Recommendations  Home health PT;Supervision/Assistance - 24 hour     Does the patient have the potential to tolerate intense rehabilitation     Barriers to Discharge        Equipment Recommendations  Rolling walker with 5" wheels    Recommendations for Other Services    Frequency Min 5X/week   Progress towards PT Goals Progress towards PT goals: Progressing toward goals  Plan Current plan remains appropriate (Pt states he will not go to NH)    Precautions / Restrictions Precautions Precautions: Back Required Braces or Orthoses: Spinal Brace Spinal Brace: Lumbar corset   Pertinent Vitals/Pain C/o pain in low back and thighs but did not rate. Used PCA during session    Mobility  Bed Mobility Bed Mobility: Rolling Left;Left Sidelying to Sit;Sit to Sidelying Left;Sitting - Scoot to Edge of  Bed Rolling Left: 5: Set up;With rail (pt refused assistance to roll other than verbal cues) Left Sidelying to Sit: 4: Min assist;With rails;HOB elevated Sitting - Scoot to Edge of Bed: 3: Mod assist Sit to Sidelying Left: 3: Mod assist;With rail;HOB elevated Details for Bed Mobility Assistance: pt has very specific way he wants things done and only wants help if he asks for it Transfers Transfers: Sit to Stand Sit to Stand: From bed;With armrests (Unable to stand. 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  PMH positive for hypertension, COPD, cervical degenerative disease, lumbar degenerative disease. He has peripheral neuropathy; etiology is unknown.    Subjective Data  Subjective: I want to do it myself   Cognition  Cognition Arousal/Alertness: Awake/alert Behavior During Therapy: WFL for tasks assessed/performed Overall Cognitive Status: Within Functional Limits for tasks assessed  Balance  Static Sitting Balance Static Sitting - Balance Support: Bilateral upper extremity supported;Feet unsupported Static Sitting - Level of Assistance: 5: Stand by assistance  End of Session PT - End of Session Activity Tolerance: Patient limited by pain (c/o of headache and back/thigh pain) Patient left: in bed;with call bell/phone within reach;with nursing/sitter in room Nurse Communication:  (RN present for  entire session)   GP     Donnella Sham 06/22/2013, 12:24 PM Lavona Mound, PT  [phone] 06/22/2013

## 2013-06-23 NOTE — Progress Notes (Signed)
OT Cancellation Note  Patient Details Name: Ryan Ibarra MRN: [id number] DOB: 11-10-1946   Cancelled Treatment:    Reason Eval/Treat Not Completed: Patient declined, no reason specified (pt stated he had already been up.) Will re-attempt.  Earlie Raveling OTR/L [phone]  06/23/2013, 4:33 PM

## 2013-06-23 NOTE — Progress Notes (Signed)
Filed Vitals:   06/22/13 2356 06/23/13 0201 06/23/13 0427 06/23/13 0600  BP:  132/65  107/60  Pulse:  65  75  Temp:  99.8 F (37.7 C)  100.6 F (38.1 C)  TempSrc:  Oral    Resp: 20 21 19 18   Height:      Weight:      SpO2: 96% 96% 97% 99%    CBC  Recent Labs  06/21/13 1423  WBC 10.6*  HGB 10.3*  HCT 28.8*  PLT 121*   BMET  Recent Labs  06/21/13 1423  NA 137  K 3.9  CL 105  CO2 25  GLUCOSE 109*  BUN 15  CREATININE 0.90  CALCIUM 7.7*    Progress has been slow as regards mobility. Has been able to sit up on the side of the bed, and stand, but has not ambulated. Wound clean and dry. Continuing PT and OT.  Plan: Continue to progress the postoperative recovery. Have asked nursing staff discussed with PT whether the patient might benefit from CIR.  Hewitt Shorts, MD 06/23/2013, 9:33 AM

## 2013-06-23 NOTE — Progress Notes (Signed)
PT Cancellation Note  Patient Details Name: PAXON PROPES MRN: [id number] DOB: [birth year]   Cancelled Treatment:    Reason Eval/Treat Not Completed: Patient declined, no reason specified, unable to get back this pm. 06/23/2013  Endicott Bing, PT [phone]  (pager)   , Eliseo Gum 06/23/2013, 5:18 PM

## 2013-06-24 NOTE — Progress Notes (Signed)
Occupational Therapy Treatment Patient Details Name: Ryan Ibarra MRN: [id number] DOB: 1946/05/27 Today's Date: 06/24/2013 Time:  -     OT Assessment / Plan / Recommendation  OT comments  Pt with limited participation.  Pt easily frustrates with BADL instruction with AE, and now states family will be with him 24/7, and they will assist him with all ADLs - goals modified accordingly.  Pt. Requires mod-max cues to adhere to back precautions as he is insistent on doing things his way despite instruction otherwise.  Follow Up Recommendations  Home health OT;Supervision/Assistance - 24 hour    Barriers to Discharge       Equipment Recommendations  None recommended by OT    Recommendations for Other Services    Frequency Min 2X/week   Progress towards OT Goals Progress towards OT goals: Not progressing toward goals - comment;Goals updated - see care plan (Pt with limited participation)  Plan Discharge plan needs to be updated    Precautions / Restrictions Precautions Precautions: Back Precaution Booklet Issued: Yes (comment) Precaution Comments: Pt unable to recall back precautions even when referencing handout Required Braces or Orthoses: Spinal Brace Spinal Brace: Lumbar corset       ADL  Lower Body Dressing: +1 Total assistance Where Assessed - Lower Body Dressing: Supported sit to stand Toilet Transfer: Minimal assistance Toilet Transfer Method: Sit to stand;Stand pivot Equipment Used: Reacher Transfers/Ambulation Related to ADLs: min A to ambulate in room with walker ADL Comments: Pt instructed in back precautions - requires mod -max cues to adhere to them.  Pt. instructed in use of AE.  Pt struggled to doff sock with reacher, then states 67 y.o. or other family will assist him with LB ADLs.  Pt reports he is not interested in AE, not further instruction in BADLs.  He assures therapist that he will definitely have assist while on precautions (8-12 weeks).  Pt states "something  went wrong"  referring to surgery.  He is insistent that his Rt. Thigh is spasming, and kept asking therapist to look at it - no spasm was visibly noticeable.  Pt moves Rt. LE up and down rapidly, volitionally before standing.       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  PMH positive for hypertension, COPD, cervical degenerative disease, lumbar degenerative disease. He has peripheral neuropathy; etiology is unknown.    Subjective Data      Prior Functioning       Cognition  Cognition Arousal/Alertness: Awake/alert Behavior During Therapy: WFL for tasks assessed/performed Overall Cognitive Status: Difficult to assess Memory: Decreased recall of precautions Difficult to assess due to:  (Pt will only engage when he chooses.)    Mobility  Bed Mobility Bed Mobility: Sit to Sidelying Left Sit to Sidelying Left: 3: Mod assist;HOB flat Details for Bed Mobility Assistance: Pt will only allow therapist to assist when he chooses, and will not  accept instruction or feedback.  Pt requested assist to lift LEs.  Pt twisting when moving from sidelying to supine.  When this was pointed out to him, he states "yeah, I know, but makes no effort to correct despite instruction provided.  Transfers Transfers: Sit to Stand;Stand to Sit Sit to Stand: 4: Min guard;With upper  extremity assist;From chair/3-in-1 Stand to Sit: 4: Min assist;With upper extremity assist;To bed Details for Transfer Assistance: Cues for hand placement and safety     Exercises      Balance     End of Session OT - End of Session Equipment Utilized During Treatment: Rolling walker;Back brace Activity Tolerance: Other (comment) (Pt is self limiting) Patient left: in bed;with call bell/phone within reach;with bed alarm set Nurse Communication: Mobility status  GO     Ryan Ibarra M 06/24/2013, 11:34 AM

## 2013-06-24 NOTE — Progress Notes (Signed)
Physical Therapy Treatment Patient Details Name: Ryan Ibarra MRN: [id number] DOB: 1945-12-28 Today's Date: 06/24/2013 Time: 0812-0856 PT Time Calculation (min): 44 min  PT Assessment / Plan / Recommendation  PT Comments   Patient continues to be limited with mobility. Requires increased time for all task as he does not take instruction way and wants to go his own way. Spoke with nursing about patients mobility and trying to have patient sit up today with standing breaks every hour.  Follow Up Recommendations  Home health PT;Supervision/Assistance - 24 hour     Does the patient have the potential to tolerate intense rehabilitation     Barriers to Discharge        Equipment Recommendations  Rolling walker with 5" wheels    Recommendations for Other Services    Frequency Min 5X/week   Progress towards PT Goals Progress towards PT goals: Progressing toward goals  Plan Current plan remains appropriate    Precautions / Restrictions Precautions Precautions: Back Precaution Booklet Issued: Yes (comment) Precaution Comments: Pt unable to recall back precautions even when referencing handout Required Braces or Orthoses: Spinal Brace Spinal Brace: Lumbar corset   Pertinent Vitals/Pain States pain is better now up in chair.     Mobility  Bed Mobility Bed Mobility: Sit to Sidelying Left Rolling Left: With rail;4: Min guard Right Sidelying to Sit: 4: Min guard;With rails Sit to Sidelying Left: 3: Mod assist;HOB flat Details for Bed Mobility Assistance: Patient able to use rail and able to do without physical assist. Likes completing in his own way in his own time Transfers Sit to Stand: 4: Min guard;With upper extremity assist;From chair/3-in-1 Stand to Sit: 4: Min assist;With upper extremity assist;To bed Details for Transfer Assistance: Cues for hand placement and safety  Ambulation/Gait Ambulation/Gait Assistance: 4: Min guard Ambulation Distance (Feet): 4 Feet Assistive  device: Rolling walker Ambulation/Gait Assistance Details: Patient able to walk forward and side a couple of steps. 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  PMH positive for hypertension, COPD, cervical degenerative disease, lumbar degenerative disease. He has peripheral neuropathy; etiology is unknown.    Subjective Data      Cognition  Cognition Arousal/Alertness: Awake/alert Behavior During Therapy: WFL for tasks assessed/performed Overall Cognitive Status: Within Functional Limits for tasks assessed Memory: Decreased recall of precautions Difficult to assess due to:  (Pt will only engage when he chooses.)    Balance     End of Session PT - End of Session Activity Tolerance: Patient limited by pain Patient left: in chair;with call bell/phone within reach   GP     Robinette, Adline Potter 06/24/2013, 11:52 AM 06/24/2013 Fredrich Birks PTA [phone] pager [phone] office

## 2013-06-24 NOTE — Progress Notes (Signed)
Filed Vitals:   06/24/13 0800 06/24/13 1000 06/24/13 1117 06/24/13 1402  BP:  121/60  123/71  Pulse:  68  75  Temp:  98.7 F (37.1 C)  98.3 F (36.8 C)  TempSrc:  Oral  Oral  Resp: 16 18 18 18   Height:      Weight:      SpO2: 98% 96% 98% 94%    Still having difficulties with incisional pain and associated limitations. Continues on PCA narcotic. Did work with PT and OT today, did walk in room, and set up in chair at bedside. Encouraged to try and transition to oral analgesia, so that we can eventually discontinue PCA.  Plan: We'll continue PT and OT. Continue postoperative care.  Hewitt Shorts, MD 06/24/2013, 3:29 PM

## 2013-06-25 MED ORDER — OXYCODONE HCL 5 MG PO TABS
2.5000 mg | ORAL_TABLET | ORAL | Status: DC | PRN
  Administered 2013-06-25 – 2013-06-26 (×6): 2.5 mg via ORAL
  Filled 2013-06-25 (×5): qty 1

## 2013-06-25 MED ORDER — OXYCODONE-ACETAMINOPHEN 5-325 MG PO TABS
1.0000 | ORAL_TABLET | ORAL | Status: DC | PRN
  Administered 2013-06-25 – 2013-06-26 (×6): 1 via ORAL
  Filled 2013-06-25 (×6): qty 1

## 2013-06-25 MED ORDER — HYDROMORPHONE HCL PF 1 MG/ML IJ SOLN
1.0000 mg | INTRAMUSCULAR | Status: DC | PRN
  Administered 2013-06-25 – 2013-06-27 (×8): 2 mg via INTRAMUSCULAR
  Filled 2013-06-25 (×8): qty 2

## 2013-06-25 NOTE — Progress Notes (Signed)
Filed Vitals:   06/25/13 0107 06/25/13 0200 06/25/13 0555 06/25/13 0600  BP:  115/62  125/75  Pulse:  63  70  Temp:  98.1 F (36.7 C)  98.4 F (36.9 C)  TempSrc:      Resp: 18 18 14 16   Height:      Weight:      SpO2: 98% 100% 96% 97%    Patient sitting up in chair, in lumbar brace. Brace removed, and will be examined. Dressing over drain site removed. Wound healing well, clean and dry, no erythema, swelling, or drainage.  Patient continues on Dilaudid PCA. He has not received any OxyIR, despite my having spoken with the patient and the nursing staff about trying to transition to oral analgesia. Patient explains to me that he has been on Percocet 10/325 every 4 hours or more often for a year. Therefore he is not narcotic naive and will require moderately sizable dosages of narcotics postoperatively. I've recommended that he begin regimen of Percocet 7.5/325 1-2 every 4 hours when necessary pain, and discontinue his Dilaudid PCA, in order Dilaudid IM for rescue.  I spoke with the patient as well as with his daughter regarding his condition, I've encouraged patient and his daughter and the nursing staff that he needs to be increasing his ambulation activity.  Plan: Continued to progress through postoperative recovery.  Hewitt Shorts, MD 06/25/2013, 8:45 AM

## 2013-06-25 NOTE — Progress Notes (Signed)
Physical Therapy Treatment Patient Details Name: Ryan Ibarra MRN: [id number] DOB: March 10, 1946 Today's Date: 06/25/2013 Time: 9629-5284 PT Time Calculation (min): 26 min  PT Assessment / Plan / Recommendation  PT Comments   Pt continues to be adamant about doing thing his way.  However pt able to increase ambulation distance and sit in chair at end of session.  Pt unable to recall 3/3 back precautions when asked.  However when pt attempting to donn socks pt knew not to bend over.  Pt able to doff socks but unable to donn without bending.  Pt need minimal assistance to donn brace.  Pt easily agitated and slightly impulsive.  Will continue to follow.    Follow Up Recommendations  Home health PT;Supervision/Assistance - 24 hour     Does the patient have the potential to tolerate intense rehabilitation     Barriers to Discharge        Equipment Recommendations  Rolling walker with 5" wheels    Recommendations for Other Services    Frequency Min 5X/week   Progress towards PT Goals Progress towards PT goals: Progressing toward goals  Plan Current plan remains appropriate    Precautions / Restrictions Precautions Precautions: Back Precaution Booklet Issued: Yes (comment) Precaution Comments: Pt stated "I'm trying to put my socks on using those stupid rules yall have."  (Pt unable to recall back precautions but was trying to donn socks without bending over.) Required Braces or Orthoses: Spinal Brace Spinal Brace: Lumbar corset Restrictions Weight Bearing Restrictions: No   Pertinent Vitals/Pain 9.4/10 pain (Pt initially said 4/10 back pain however when therapist repeated the number 4.  Pt stated "I didn't say four.  Did you hear four?  I said 9.4") Pt continues to be on PCA.    Mobility  Bed Mobility Bed Mobility: Left Sidelying to Sit Rolling Right: 4: Min guard Details for Bed Mobility Assistance: Minguard for safety and heavy use of rail to sit EOB Transfers Transfers: Sit to  Stand;Stand to Sit Sit to Stand: 4: Min assist;From bed Stand to Sit: 4: Min guard;To chair/3-in-1 Details for Transfer Assistance: Max cues for hand placement.  Pt wants to pull up from using IV pole and educated pt on the importance of  Ambulation/Gait Ambulation/Gait Assistance: 4: Min guard Ambulation Distance (Feet): 15 Feet Assistive device: Rolling walker Ambulation/Gait Assistance Details: Pt able to increase ambulation distance with cues for RW placement.  Pt with slight forward flexed posture. 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  PMH positive for hypertension, COPD, cervical degenerative disease, lumbar degenerative disease. He has peripheral neuropathy; etiology is unknown.    Subjective Data  Subjective: "I think I should pull up from the IV pole.  Did they not teach yall anything about gravity and resistance? " Patient Stated Goal: "To get out of here"   Cognition  Cognition Arousal/Alertness: Awake/alert  Behavior During Therapy: Impulsive (Easily agitated when doesn't do things his way) Overall Cognitive Status: Difficult to assess (Possible close to baseline however seems slow to process) Difficult to assess due to:  ((Pt with slurred speech and difficult to understand at times)    Balance     End of Session PT  - End of Session Equipment Utilized During Treatment: Gait belt;Back brace Activity Tolerance: Patient limited by pain Patient left: in chair;with call bell/phone within reach Nurse Communication: Mobility status   GP     , 06/25/2013, 8:53 AM  Jake Shark, PT DPT [phone]

## 2013-06-25 NOTE — Progress Notes (Signed)
Up all night. Seems confused. States " Its 1:39 and you have to get up and do something. Get up and do something. I heard you out there giggling. i responded "Yes I'm talking to my coworker" He responded that's the biggest lie. They don't help you do nothing" I responded my coworkers and I work very well together. He asked " Do you have children?" I responded "yes". He responded " Well how many men done climbed in that bed with you and help you bear those children?" I responded " That's personal" and walked out of the room.

## 2013-06-25 NOTE — Progress Notes (Signed)
Patient is noncompliant with incentive spirometer.

## 2013-06-26 MED ORDER — OXYCODONE HCL 5 MG PO TABS: 2.5000 mg | ORAL_TABLET | ORAL | Status: DC | PRN

## 2013-06-26 MED ORDER — OXYCODONE-ACETAMINOPHEN 5-325 MG PO TABS
1.0000 | ORAL_TABLET | ORAL | Status: DC | PRN
  Administered 2013-06-26: 2 via ORAL
  Administered 2013-06-26: 1 via ORAL
  Administered 2013-06-27: 2 via ORAL
  Administered 2013-06-27 (×2): 1 via ORAL
  Administered 2013-06-27: 2 via ORAL
  Filled 2013-06-26: qty 1
  Filled 2013-06-26 (×3): qty 2
  Filled 2013-06-26: qty 1
  Filled 2013-06-26: qty 2
  Filled 2013-06-26: qty 1

## 2013-06-26 MED ORDER — KETOROLAC TROMETHAMINE 30 MG/ML IJ SOLN
30.0000 mg | Freq: Four times a day (QID) | INTRAMUSCULAR | Status: DC
  Administered 2013-06-26 – 2013-06-27 (×6): 30 mg via INTRAVENOUS
  Filled 2013-06-26 (×7): qty 1

## 2013-06-26 MED ORDER — OXYCODONE HCL 5 MG PO TABS
2.5000 mg | ORAL_TABLET | ORAL | Status: DC | PRN
  Administered 2013-06-26: 21:00:00 via ORAL
  Administered 2013-06-26 – 2013-06-27 (×3): 5 mg via ORAL
  Filled 2013-06-26: qty 1
  Filled 2013-06-26: qty 2
  Filled 2013-06-26 (×3): qty 1

## 2013-06-26 MED ORDER — OXYCODONE-ACETAMINOPHEN 5-325 MG PO TABS: 1.0000 | ORAL_TABLET | ORAL | Status: DC | PRN

## 2013-06-26 MED FILL — Heparin Sodium (Porcine) Inj 1000 Unit/ML: INTRAMUSCULAR | Qty: 30 | Status: AC

## 2013-06-26 MED FILL — Sodium Chloride IV Soln 0.9%: INTRAVENOUS | Qty: 4000 | Status: AC

## 2013-06-26 NOTE — Progress Notes (Signed)
Picked up pt at midnight tonight.  Pt called for pain meds prior to handing off pt, and during report pt told the NT that if he had to wait for pain meds that he would "act like I did the other night".  Pt is not responding to the idea of weaning off of IM medication and becomes upset when it is mentioned.

## 2013-06-26 NOTE — Progress Notes (Signed)
Physical Therapy Treatment Patient Details Name: Ryan Ibarra MRN: [id number] DOB: [birth year] Today's Date: 06/26/2013 Time: 9528-4132 PT Time Calculation (min): 29 min  PT Assessment / Plan / Recommendation  PT Comments   Ambulating more today. Pain appears better controlled. Continues to direct therapy sessions and is resistant to education concerning back precautions. Agreeable to ambulating 3 more times today with nursing staff.   Follow Up Recommendations  Home health PT;Supervision/Assistance - 24 hour     Does the patient have the potential to tolerate intense rehabilitation     Barriers to Discharge        Equipment Recommendations  Rolling walker with 5" wheels    Recommendations for Other Services    Frequency Min 5X/week   Progress towards PT Goals Progress towards PT goals: Progressing toward goals  Plan Current plan remains appropriate    Precautions / Restrictions Precautions Precautions: Fall Precaution Comments: Instructed patient in back precautions however stated "these make no sense to me," attempted to educate patient why these might be important to protect his back and prevent injury and pt took this as a threat saying "I won't take threats that I will get injured, I will do what I want to do. I will be a paraplegic before I have another surgery"  Spinal Brace: Lumbar corset;Applied in sitting position   Pertinent Vitals/Pain Reports pain 9.75/10 initially, RN provided pain meds and I returned 20 min following med administration    Mobility  Bed Mobility Bed Mobility: Rolling Left;Left Sidelying to Sit Rolling Left: With rail;6: Modified independent (Device/Increase time) Left Sidelying to Sit: With rails;HOB elevated;6: Modified independent (Device/Increase time) (30 degrees) Details for Bed Mobility Assistance: pt performing with use of rails, no cueing needed, increased time because of pain Transfers Transfers: Sit to Stand;Stand to Sit Sit to Stand:  5: Supervision;With upper extremity assist Stand to Sit: 5: Supervision;With upper extremity assist Details for Transfer Assistance: verbal and demonstrative cues for safe hand placement and body positioning, pt able to perform with supervision (did pull on the RW with LUE)  Ambulation/Gait Ambulation/Gait Assistance: 5: Supervision Ambulation Distance (Feet): 200 Feet Assistive device: Rolling walker Ambulation/Gait Assistance Details: cues for safe technique with RW (pt picking it up and placing it forward, then walking too close to it), very resistant to verbal cues however after about 50 ft he started to walk with improved technique Gait Pattern: Step-through pattern;Narrow base of support;Decreased stride length Gait velocity: decreased      PT Goals (current goals can now be found in the care plan section)    Visit Information  Last PT Received On: 06/26/13 Assistance Needed: +1 History of Present Illness: Patient is a 67 y/o male 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  PMH positive for hypertension, COPD, cervical degenerative disease, lumbar degenerative disease. He has peripheral neuropathy; etiology is unknown.    Subjective Data      Cognition  Cognition Arousal/Alertness: Awake/alert Behavior During Therapy: Agitated (frustrated with back precautions) Overall Cognitive Status: Difficult to assess    Balance  Static Sitting Balance Static Sitting - Balance Support: Bilateral upper extremity supported Static Sitting - Level of Assistance: 5: Stand by assistance  End of Session PT - End of Session Equipment Utilized During Treatment: Gait belt;Back brace Activity Tolerance: Patient tolerated treatment well Patient left: in chair;with call bell/phone within reach Nurse Communication: Mobility status   GP     [facility] HELEN 06/26/2013,  9:31 AM

## 2013-06-26 NOTE — Progress Notes (Signed)
Occupational Therapy Treatment Patient Details Name: Ryan Ibarra MRN: [id number] DOB: 07-08-1946 Today's Date: 06/26/2013 Time: 1341-1406 OT Time Calculation (min): 25 min  OT Assessment / Plan / Recommendation  OT comments  Pt with increased participation, however, pt is non compliant with back precautions and is not receptive to instruction, correction, etc.  He was very focused on his pain medications and how much he had to take on a daily basis PTA.  He reports family will assist him with BADLs.   Follow Up Recommendations  Home health OT;Supervision/Assistance - 24 hour    Barriers to Discharge       Equipment Recommendations  None recommended by OT    Recommendations for Other Services    Frequency Min 2X/week   Progress towards OT Goals Progress towards OT goals: Not progressing toward goals - comment  Plan Discharge plan remains appropriate    Precautions / Restrictions Precautions Precautions: Fall Precaution Booklet Issued: Yes (comment) Precaution Comments: Pt able to recall 0/3 back precautions.  Pt instructed in back precautions, and he acts as if he has never heard them before Required Braces or Orthoses: Spinal Brace Spinal Brace: Lumbar corset;Applied in sitting position       ADL  Transfers/Ambulation Related to ADLs: min guard assist ADL Comments: Pt instructed in back precautions and safe methods for activities.  Pt. is non compliant with back precautions, and is no receptive to feedback, correction, or instruction.  He continues to state that family will assist with BADLs, and he does not need instruction.  Pt. donned brace with supervision, but donned it upside down, not receptive to correction by therapist    OT Diagnosis:    OT Problem List:   OT Treatment Interventions:     OT Goals(current goals can now be found in the care plan section) Acute Rehab OT Goals Patient Stated Goal: "To get out of here" OT Goal Formulation: With patient Time For Goal  Achievement: 07/04/13 Potential to Achieve Goals: Good ADL Goals Pt Will Perform Grooming: with supervision;standing Pt Will Perform Upper Body Bathing: with set-up;sitting Pt Will Perform Lower Body Bathing: with supervision;sit to/from stand Pt Will Perform Upper Body Dressing: with set-up;sitting Pt Will Perform Lower Body Dressing: with min assist;sit to/from stand Pt Will Transfer to Toilet: with supervision;ambulating;bedside commode Pt Will Perform Toileting - Clothing Manipulation and hygiene: with supervision;sit to/from stand Additional ADL Goal #1: Pt will demonstrates understanding of back precautions with all ADLs/functional mobility with no more than min verbal cues  Visit Information  Last OT Received On: 06/26/13 Assistance Needed: +1 History of Present Illness: Patient is a 67 y/o male 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  PMH positive for hypertension, COPD, cervical degenerative disease, lumbar degenerative disease. He has peripheral neuropathy; etiology is unknown.    Subjective Data      Prior Functioning       Cognition  Cognition Arousal/Alertness: Awake/alert Behavior During Therapy: WFL for tasks assessed/performed Overall Cognitive Status: No family/caregiver present to determine baseline cognitive functioning Memory: Decreased recall of precautions    Mobility  Bed Mobility Supine to Sit: 5: Supervision;HOB elevated;With rails Sitting - Scoot to Edge of Bed: 5: Supervision Sit to Sidelying Right: 5: Supervision;HOB elevated;With rail Details for Bed Mobility Assistance: Pt demonstrates unsafe technique and non compliance with back precautions, but is not receptive to instruction or correction  Transfers Transfers: Sit to Stand;Stand to Sit Sit to Stand: 5:  Supervision;With upper extremity assist Stand to Sit: 5: Supervision;With upper extremity  assist Details for Transfer Assistance: non compliance with back precautions    Exercises      Balance     End of Session OT - End of Session Equipment Utilized During Treatment: Rolling walker;Back brace Activity Tolerance: Patient tolerated treatment well Patient left: in bed;with call bell/phone within reach;with bed alarm set  GO     , Ursula Alert M 06/26/2013, 2:25 PM

## 2013-06-26 NOTE — Progress Notes (Signed)
Filed Vitals:   06/25/13 1755 06/25/13 2151 06/26/13 0134 06/26/13 0534  BP: 120/67 128/63 118/61 126/67  Pulse: 79 66 67 64  Temp: 98.7 F (37.1 C) 99 F (37.2 C) 98.6 F (37 C) 98.3 F (36.8 C)  TempSrc: Oral Oral Oral Oral  Resp: 18 18 16 18   Height:      Weight:      SpO2: 99% 99% 97% 98%    Patient sitting up on the side of the bed. Has been receiving Percocet 7.5/325 1 tablet every 4 hours, as well as Dilaudid IM. Patient is still having significant pain he describes mostly discomfort in the thighs, and explained is actually having very little in the way of incisional pain. I had intended for him to be able to take Percocet 7.5/325 one or 2 every 4 hours and have spoken with the pharmacist to try to adjust the order.  His excision is healed nicely. There is no swelling, erythema, or drainage.  Plan: With the discomfort in the thighs, I recommended the patient we add an anti-inflammatory medication, we'll have a saline lock restarted and start Toradol 30 mg IV every 6 hours for 8 doses. I've encouraged patient to begin increasing his ambulation. He has not been ambulating in the halls. I have encouraged the patient to walk with the staff at least 4 times in the hallways each day.  Hewitt Shorts, MD 06/26/2013, 8:43 AM

## 2013-06-27 MED ORDER — OXYCODONE HCL 5 MG PO TABS: 5.0000 mg | ORAL_TABLET | ORAL | Status: DC | PRN

## 2013-06-27 MED ORDER — OXYCODONE-ACETAMINOPHEN 10-325 MG PO TABS: 1.0000 | ORAL_TABLET | ORAL | Status: DC | PRN

## 2013-06-27 NOTE — Progress Notes (Signed)
PT Cancellation Note  Patient Details Name: Ryan Ibarra MRN: [id number] DOB: August 03, 1946   Cancelled Treatment:    Reason Eval/Treat Not Completed: Pain limiting ability to participate. Patient reports his neuropathy is acting up. Noted his plans to d/c home today. Patient denies any concerns with mobility at this time and declines PT to practice.    [facility] HELEN 06/27/2013, 2:12 PM Pager: [phone]

## 2013-06-27 NOTE — Progress Notes (Signed)
Pt ambulated around the unit with RN tonight with assist of front-wheel walker.  Pt tolerated ambulation well, back to bed without incidence.  Will continue to encourage ambulation.

## 2013-06-27 NOTE — Progress Notes (Addendum)
Upon rounds, pt reports to RN that he feels that he is medically stable enough for discharge and that he only feels that his pain will be managed appropriately with the meds he was taking prior to admission.  He also states that he has not been communicated with about his plan of care to his liking and that he will be leaving AMA today if not formally discharged.  Plan of care was discussed with pt, pt understands.   Pt ambulated for a second time early this am.

## 2013-06-27 NOTE — Discharge Summary (Signed)
Physician Discharge Summary  Patient ID: Ryan Ibarra MRN: [id number] DOB/AGE: Oct 09, 1946 67 y.o.  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 Franky Macho who performed a 3 level lumbar decompression, PLIF, and PLA with interbody implants posterior instrumentation. Postoperatively he had difficulty with pain management, but gradually pain management regimen was adjusted. He was treated with IV Toradol which helped. His wound is healing well. He is up and ambulating with a rolling of her. He's been seen by physical therapy and occupational therapy. He's been advised to have a rolling lock or, in order has been made for such. He is being discharged home with instructions regarding wound care and activities. He is to return for followup with Dr. Franky Macho a couple weeks.  Discharge Exam: Blood pressure 137/63, pulse 86, temperature 98.2 F (36.8 C), temperature source Oral, resp. rate 18, height 5\' 11"  (1.803 m), weight 81.6 kg (179 lb 14.3 oz), SpO2 100.00%.  Disposition:  Home     Medication List         ALPRAZolam 0.5 MG tablet  Commonly known as:  XANAX  Take 0.5 mg by mouth at bedtime as needed for sleep.     aspirin 81 MG tablet  Take 81 mg by mouth daily.     gabapentin 300 MG capsule  Commonly known as:  NEURONTIN  Take 600-900 mg by mouth 3 (three) times daily.     lisinopril 20 MG tablet  Commonly known as:  PRINIVIL,ZESTRIL  Take 20 mg by mouth daily.     meloxicam 15 MG tablet  Commonly known as:   MOBIC  Take 15 mg by mouth at bedtime.     multivitamin with minerals Tabs  Take 1 tablet by mouth daily.     oxyCODONE 5 MG immediate release tablet  Commonly known as:  Oxy IR/ROXICODONE  Take 1 tablet (5 mg total) by mouth every 4 (four) hours as needed.     oxyCODONE-acetaminophen 10-325 MG per tablet  Commonly known as:  PERCOCET  Take 1 tablet by mouth every 4 (four) hours as needed for pain.     oxyCODONE-acetaminophen 10-325 MG per tablet  Commonly known as:  PERCOCET  Take 2 tablets by mouth every 4 (four) hours as needed for pain.     PARoxetine 20 MG tablet  Commonly known as:  PAXIL  Take 20 mg by mouth every morning.     ranitidine 150 MG tablet  Commonly known as:  ZANTAC  Take 150 mg by mouth 2 (two) times daily.     simvastatin 40 MG tablet  Commonly known as:  ZOCOR  Take 40 mg by mouth every evening.     traZODone 50 MG tablet  Commonly known as:  DESYREL  Take 150 mg by mouth at bedtime.         SignedHewitt Shorts, MD 06/27/2013, 2:05 PM

## 2013-10-18 NOTE — Patient Instructions (Signed)
HAYTHAM MAHER  10/18/2013   Your procedure is scheduled on:  10/28/13  Report to [facility] at 0700 AM.  Call this number if you have problems the morning of surgery: [phone]   Remember:   Do not eat food or drink liquids after midnight.   Take these medicines the morning of surgery with A SIP OF WATER: xanax, paxil, neurontin, pain pill, zantac   Do not wear jewelry, make-up or nail polish.  Do not wear lotions, powders, or perfumes. You may wear deodorant.  Do not shave 48 hours prior to surgery. Men may shave face and neck.  Do not bring valuables to the hospital.  [facility] is not responsible                  for any belongings or valuables.               Contacts, dentures or bridgework may not be worn into surgery.  Leave suitcase in the car. After surgery it may be brought to your room.  For patients admitted to the hospital, discharge time is determined by your                treatment team.               Patients discharged the day of surgery will not be allowed to drive  home.  Name and phone number of your driver: family  Special Instructions: N/A   Please read over the following fact sheets that you were given: Anesthesia Post-op Instructions and Care and Recovery After Surgery   PATIENT INSTRUCTIONS POST-ANESTHESIA  IMMEDIATELY FOLLOWING SURGERY:  Do not drive or operate machinery for the first twenty four hours after surgery.  Do not make any important decisions for twenty four hours after surgery or while taking narcotic pain medications or sedatives.  If you develop intractable nausea and vomiting or a severe headache please notify your doctor immediately.  FOLLOW-UP:  Please make an appointment with your surgeon as instructed. You do not need to follow up with anesthesia unless specifically instructed to do so.  WOUND CARE INSTRUCTIONS (if applicable):  Keep a dry clean dressing on the anesthesia/puncture wound site if there is drainage.  Once the wound has  quit draining you may leave it open to air.  Generally you should leave the bandage intact for twenty four hours unless there is drainage.  If the epidural site drains for more than 36-48 hours please call the anesthesia department.  QUESTIONS?:  Please feel free to call your physician or the hospital operator if you have any questions, and they will be happy to assist you.      Cataract Surgery  A cataract is a clouding of the lens of the eye. When a lens becomes cloudy, vision is reduced based on the degree and nature of the clouding. Surgery may be needed to improve vision. Surgery removes the cloudy lens and usually replaces it with a substitute lens (intraocular lens, IOL). LET YOUR EYE DOCTOR KNOW ABOUT:  Allergies to food or medicine.  Medicines taken including herbs, eyedrops, over-the-counter medicines, and creams.  Use of steroids (by mouth or creams).  Previous problems with anesthetics or numbing medicine.  History of bleeding problems or blood clots.  Previous surgery.  Other health problems, including diabetes and kidney problems.  Possibility of pregnancy, if this applies. RISKS AND COMPLICATIONS  Infection.  Inflammation of the eyeball (endophthalmitis) that can spread to both eyes (sympathetic  ophthalmia).  Poor wound healing.  If an IOL is inserted, it can later fall out of proper position. This is very uncommon.  Clouding of the part of your eye that holds an IOL in place. This is called an "after-cataract." These are uncommon, but easily treated. BEFORE THE PROCEDURE  Do not eat or drink anything except small amounts of water for 8 to 12 before your surgery, or as directed by your caregiver.  Unless you are told otherwise, continue any eyedrops you have been prescribed.  Talk to your primary caregiver about all other medicines that you take (both prescription and non-prescription). In some cases, you may need to stop or change medicines near the time of  your surgery. This is most important if you are taking blood-thinning medicine.Do not stop medicines unless you are told to do so.  Arrange for someone to drive you to and from the procedure.  Do not put contact lenses in either eye on the day of your surgery. PROCEDURE There is more than one method for safely removing a cataract. Your doctor can explain the differences and help determine which is best for you. Phacoemulsification surgery is the most common form of cataract surgery.  An injection is given behind the eye or eyedrops are given to make this a painless procedure.  A small cut (incision) is made on the edge of the clear, dome-shaped surface that covers the front of the eye (cornea).  A tiny probe is painlessly inserted into the eye. This device gives off ultrasound waves that soften and break up the cloudy center of the lens. This makes it easier for the cloudy lens to be removed by suction.  An IOL may be implanted.  The normal lens of the eye is covered by a clear capsule. Part of that capsule is intentionally left in the eye to support the IOL.  Your surgeon may or may not use stitches to close the incision. There are other forms of cataract surgery that require a larger incision and stiches to close the eye. This approach is taken in cases where the doctor feels that the cataract cannot be easily removed using phacoemulsification. AFTER THE PROCEDURE  When an IOL is implanted, it does not need care. It becomes a permanent part of your eye and cannot be seen or felt.  Your doctor will schedule follow-up exams to check on your progress.  Review your other medicines with your doctor to see which can be resumed after surgery.  Use eyedrops or take medicine as prescribed by your doctor. Document Released: 11/24/2011 Document Revised: 02/27/2012 Document Reviewed: 11/24/2011 [facility] Patient Information 2014 [place], [place].

## 2013-10-21 ENCOUNTER — Encounter (HOSPITAL_COMMUNITY): Payer: Self-pay

## 2013-10-21 ENCOUNTER — Encounter (HOSPITAL_COMMUNITY)
Admission: RE | Admit: 2013-10-21 | Discharge: 2013-10-21 | Disposition: A | Discharge status: Home / Self Care | Payer: Medicare Other | Source: Ambulatory Visit | Attending: Ophthalmology | Admitting: Ophthalmology

## 2013-10-21 ENCOUNTER — Encounter (HOSPITAL_COMMUNITY): Payer: Self-pay | Admitting: Pharmacy Technician

## 2013-10-21 DIAGNOSIS — Z01812 Encounter for preprocedural laboratory examination: Secondary | ICD-10-CM | POA: Insufficient documentation

## 2013-10-21 DIAGNOSIS — Z01818 Encounter for other preprocedural examination: Secondary | ICD-10-CM | POA: Insufficient documentation

## 2013-10-21 HISTORY — DX: Polyneuropathy, unspecified: G62.9

## 2013-10-21 HISTORY — DX: Major depressive disorder, single episode, unspecified: F32.9

## 2013-10-21 HISTORY — DX: Other chronic pain: G89.29

## 2013-10-21 HISTORY — DX: Depression, unspecified: F32.A

## 2013-10-21 HISTORY — DX: Hyperlipidemia, unspecified: E78.5

## 2013-10-21 HISTORY — DX: Dorsalgia, unspecified: M54.9

## 2013-10-21 HISTORY — DX: Unspecified osteoarthritis, unspecified site: M19.90

## 2013-10-21 LAB — BASIC METABOLIC PANEL WITH GFR
BUN: 13 mg/dL (ref 6–23)
CO2: 32 meq/L (ref 19–32)
Calcium: 9.9 mg/dL (ref 8.4–10.5)
Chloride: 101 meq/L (ref 96–112)
Creatinine, Ser: 1.12 mg/dL (ref 0.50–1.35)
GFR calc Af Amer: 78 mL/min — ABNORMAL LOW
GFR calc non Af Amer: 67 mL/min — ABNORMAL LOW
Glucose, Bld: 99 mg/dL (ref 70–99)
Potassium: 5.4 meq/L — ABNORMAL HIGH (ref 3.5–5.1)
Sodium: 141 meq/L (ref 135–145)

## 2013-10-21 LAB — HEMOGLOBIN AND HEMATOCRIT, BLOOD
HCT: 39.7 % (ref 39.0–52.0)
Hemoglobin: 14.1 g/dL (ref 13.0–17.0)

## 2013-10-25 MED ORDER — TETRACAINE HCL 0.5 % OP SOLN
OPHTHALMIC | Status: AC
  Filled 2013-10-25: qty 2

## 2013-10-25 MED ORDER — LIDOCAINE HCL (PF) 1 % IJ SOLN
INTRAMUSCULAR | Status: AC
  Filled 2013-10-25: qty 2

## 2013-10-25 MED ORDER — PHENYLEPHRINE HCL 2.5 % OP SOLN
OPHTHALMIC | Status: AC
  Filled 2013-10-25: qty 15

## 2013-10-25 MED ORDER — CYCLOPENTOLATE-PHENYLEPHRINE OP SOLN OPTIME - NO CHARGE
OPHTHALMIC | Status: AC
  Filled 2013-10-25: qty 2

## 2013-10-25 MED ORDER — LIDOCAINE HCL 3.5 % OP GEL
OPHTHALMIC | Status: AC
  Filled 2013-10-25: qty 1

## 2013-10-25 MED ORDER — NEOMYCIN-POLYMYXIN-DEXAMETH 3.5-10000-0.1 OP SUSP
OPHTHALMIC | Status: AC
  Filled 2013-10-25: qty 5

## 2013-10-28 ENCOUNTER — Encounter (HOSPITAL_COMMUNITY): Payer: Self-pay | Admitting: *Deleted

## 2013-10-28 ENCOUNTER — Encounter (HOSPITAL_COMMUNITY): Payer: Medicare Other | Admitting: Anesthesiology

## 2013-10-28 ENCOUNTER — Ambulatory Visit (HOSPITAL_COMMUNITY): Payer: Medicare Other | Admitting: Anesthesiology

## 2013-10-28 ENCOUNTER — Encounter (HOSPITAL_COMMUNITY)
Admission: RE | Disposition: A | Discharge status: Home / Self Care | Payer: Self-pay | Source: Ambulatory Visit | Attending: Ophthalmology

## 2013-10-28 ENCOUNTER — Ambulatory Visit (HOSPITAL_COMMUNITY)
Admission: RE | Admit: 2013-10-28 | Discharge: 2013-10-28 | Disposition: A | Discharge status: Home / Self Care | Payer: Medicare Other | Source: Ambulatory Visit | Attending: Ophthalmology | Admitting: Ophthalmology

## 2013-10-28 DIAGNOSIS — I10 Essential (primary) hypertension: Secondary | ICD-10-CM | POA: Insufficient documentation

## 2013-10-28 DIAGNOSIS — H2589 Other age-related cataract: Secondary | ICD-10-CM | POA: Insufficient documentation

## 2013-10-28 HISTORY — PX: CATARACT EXTRACTION W/PHACO: SHX586

## 2013-10-28 SURGERY — PHACOEMULSIFICATION, CATARACT, WITH IOL INSERTION
Anesthesia: General | Site: Eye | Laterality: Right | Wound class: Clean

## 2013-10-28 MED ORDER — PHENYLEPHRINE HCL 2.5 % OP SOLN
1.0000 [drp] | OPHTHALMIC | Status: AC
  Administered 2013-10-28 (×3): 1 [drp] via OPHTHALMIC

## 2013-10-28 MED ORDER — EPINEPHRINE HCL 1 MG/ML IJ SOLN
INTRAMUSCULAR | Status: AC
  Filled 2013-10-28: qty 1

## 2013-10-28 MED ORDER — BSS IO SOLN
INTRAOCULAR | Status: DC | PRN
  Administered 2013-10-28: 15 mL via INTRAOCULAR

## 2013-10-28 MED ORDER — TETRACAINE HCL 0.5 % OP SOLN
1.0000 [drp] | OPHTHALMIC | Status: AC
  Administered 2013-10-28 (×3): 1 [drp] via OPHTHALMIC

## 2013-10-28 MED ORDER — LACTATED RINGERS IV SOLN
INTRAVENOUS | Status: DC
  Administered 2013-10-28: 08:00:00 via INTRAVENOUS

## 2013-10-28 MED ORDER — MIDAZOLAM HCL 2 MG/2ML IJ SOLN
1.0000 mg | INTRAMUSCULAR | Status: DC | PRN
  Administered 2013-10-28: 2 mg via INTRAVENOUS
  Filled 2013-10-28: qty 2

## 2013-10-28 MED ORDER — FENTANYL CITRATE 0.05 MG/ML IJ SOLN
25.0000 ug | INTRAMUSCULAR | Status: AC
  Administered 2013-10-28 (×2): 25 ug via INTRAVENOUS
  Filled 2013-10-28: qty 2

## 2013-10-28 MED ORDER — LIDOCAINE 3.5 % OP GEL OPTIME - NO CHARGE
OPHTHALMIC | Status: DC | PRN
  Administered 2013-10-28: 2 [drp] via OPHTHALMIC

## 2013-10-28 MED ORDER — POVIDONE-IODINE 5 % OP SOLN
OPHTHALMIC | Status: DC | PRN
  Administered 2013-10-28: 1 via OPHTHALMIC

## 2013-10-28 MED ORDER — EPINEPHRINE HCL 1 MG/ML IJ SOLN
INTRAMUSCULAR | Status: DC | PRN
  Administered 2013-10-28: 08:00:00

## 2013-10-28 MED ORDER — NEOMYCIN-POLYMYXIN-DEXAMETH 3.5-10000-0.1 OP SUSP
OPHTHALMIC | Status: DC | PRN
  Administered 2013-10-28: 2 [drp] via OPHTHALMIC

## 2013-10-28 MED ORDER — LIDOCAINE HCL 3.5 % OP GEL
1.0000 "application " | Freq: Once | OPHTHALMIC | Status: AC
  Administered 2013-10-28: 1 via OPHTHALMIC

## 2013-10-28 MED ORDER — PROVISC 10 MG/ML IO SOLN
INTRAOCULAR | Status: DC | PRN
  Administered 2013-10-28: 0.85 mL via INTRAOCULAR

## 2013-10-28 MED ORDER — CYCLOPENTOLATE-PHENYLEPHRINE 0.2-1 % OP SOLN
1.0000 [drp] | OPHTHALMIC | Status: AC
  Administered 2013-10-28 (×3): 1 [drp] via OPHTHALMIC

## 2013-10-28 MED ORDER — LIDOCAINE HCL (PF) 1 % IJ SOLN
INTRAMUSCULAR | Status: DC | PRN
  Administered 2013-10-28: .5 mL

## 2013-10-28 SURGICAL SUPPLY — 32 items
CAPSULAR TENSION RING-AMO (OPHTHALMIC RELATED) IMPLANT
CLOTH BEACON ORANGE TIMEOUT ST (SAFETY) ×2 IMPLANT
EYE SHIELD UNIVERSAL CLEAR (GAUZE/BANDAGES/DRESSINGS) ×2 IMPLANT
GLOVE BIO SURGEON STRL SZ 6.5 (GLOVE) IMPLANT
GLOVE BIOGEL PI IND STRL 6.5 (GLOVE) IMPLANT
GLOVE BIOGEL PI IND STRL 7.0 (GLOVE) ×2 IMPLANT
GLOVE BIOGEL PI IND STRL 7.5 (GLOVE) IMPLANT
GLOVE BIOGEL PI INDICATOR 6.5 (GLOVE)
GLOVE BIOGEL PI INDICATOR 7.0 (GLOVE) ×2
GLOVE BIOGEL PI INDICATOR 7.5 (GLOVE)
GLOVE ECLIPSE 6.5 STRL STRAW (GLOVE) IMPLANT
GLOVE ECLIPSE 7.0 STRL STRAW (GLOVE) IMPLANT
GLOVE ECLIPSE 7.5 STRL STRAW (GLOVE) IMPLANT
GLOVE EXAM NITRILE LRG STRL (GLOVE) IMPLANT
GLOVE EXAM NITRILE MD LF STRL (GLOVE) IMPLANT
GLOVE SKINSENSE NS SZ6.5 (GLOVE)
GLOVE SKINSENSE NS SZ7.0 (GLOVE)
GLOVE SKINSENSE STRL SZ6.5 (GLOVE) IMPLANT
GLOVE SKINSENSE STRL SZ7.0 (GLOVE) IMPLANT
KIT VITRECTOMY (OPHTHALMIC RELATED) IMPLANT
PAD ARMBOARD 7.5X6 YLW CONV (MISCELLANEOUS) ×2 IMPLANT
PROC W NO LENS (INTRAOCULAR LENS)
PROC W SPEC LENS (INTRAOCULAR LENS)
PROCESS W NO LENS (INTRAOCULAR LENS) IMPLANT
PROCESS W SPEC LENS (INTRAOCULAR LENS) IMPLANT
RING MALYGIN (MISCELLANEOUS) IMPLANT
SIGHTPATH CAT PROC W REG LENS (Ophthalmic Related) ×2 IMPLANT
SYR TB 1ML LL NO SAFETY (SYRINGE) ×2 IMPLANT
TAPE SURG TRANSPORE 1 IN (GAUZE/BANDAGES/DRESSINGS) ×1 IMPLANT
TAPE SURGICAL TRANSPORE 1 IN (GAUZE/BANDAGES/DRESSINGS) ×1
VISCOELASTIC ADDITIONAL (OPHTHALMIC RELATED) IMPLANT
WATER STERILE IRR 250ML POUR (IV SOLUTION) ×2 IMPLANT

## 2013-10-28 NOTE — H&P (Signed)
I have reviewed the H&P, the patient was re-examined, and I have identified no interval changes in medical condition and plan of care since the history and physical of record  

## 2013-10-28 NOTE — Anesthesia Preprocedure Evaluation (Signed)
Anesthesia Evaluation  Patient identified by MRN, date of birth, ID band Patient awake    Reviewed: Allergy & Precautions, H&P , NPO status , Patient's Chart, lab work & pertinent test results  History of Anesthesia Complications Negative for: history of anesthetic complications  Airway Mallampati: II TM Distance: >3 FB Neck ROM: Full    Dental  (+) Edentulous Upper, Edentulous Lower and Dental Advisory Given   Pulmonary former smoker (Quit '11),  breath sounds clear to auscultation  Pulmonary exam normal       Cardiovascular hypertension, Pt. on medications Rhythm:Regular Rate:Normal     Neuro/Psych PSYCHIATRIC DISORDERS Anxiety Depression Chronic back pain: narcotics    GI/Hepatic Neg liver ROS, GERD-  Medicated and Poorly Controlled,  Endo/Other  negative endocrine ROS  Renal/GU negative Renal ROS     Musculoskeletal   Abdominal   Peds  Hematology negative hematology ROS (+)   Anesthesia Other Findings   Reproductive/Obstetrics                           Anesthesia Physical Anesthesia Plan  ASA: III  Anesthesia Plan: General   Post-op Pain Management:    Induction: Intravenous  Airway Management Planned: Nasal Cannula  Additional Equipment:   Intra-op Plan:   Post-operative Plan:   Informed Consent: I have reviewed the patients History and Physical, chart, labs and discussed the procedure including the risks, benefits and alternatives for the proposed anesthesia with the patient or authorized representative who has indicated his/her understanding and acceptance.     Plan Discussed with:   Anesthesia Plan Comments:         Anesthesia Quick Evaluation

## 2013-10-28 NOTE — Op Note (Signed)
Date of Admission: 10/28/2013  Date of Surgery: 10/28/2013  Pre-Op Dx: Cataract  Right  Eye  Post-Op Dx: Combined Cataract  Right  Eye,  Dx Code 366.19  Surgeon: Gemma Payor, M.D.  Assistants: None  Anesthesia: Topical with MAC  Indications: Painless, progressive loss of vision with compromise of daily activities.  Surgery: Cataract Extraction with Intraocular lens Implant Right Eye  Discription: The patient had dilating drops and viscous lidocaine placed into the right eye in the pre-op holding area. After transfer to the operating room, a time out was performed. The patient was then prepped and draped. Beginning with a 75 degree blade a paracentesis port was made at the surgeon's 2 o'clock position. The anterior chamber was then filled with 1% non-preserved lidocaine. This was followed by filling the anterior chamber with Provisc.  A 2.81mm keratome blade was used to make a clear corneal incision at the temporal limbus.  A bent cystatome needle was used to create a continuous tear capsulotomy. Hydrodissection was performed with balanced salt solution on a Fine canula. The lens nucleus was then removed using the phacoemulsification handpiece. Residual cortex was removed with the I&A handpiece. The anterior chamber and capsular bag were refilled with Provisc. A posterior chamber intraocular lens was placed into the capsular bag with it's injector. The implant was positioned with the Kuglan hook. The Provisc was then removed from the anterior chamber and capsular bag with the I&A handpiece. Stromal hydration of the main incision and paracentesis port was performed with BSS on a Fine canula. The wounds were tested for leak which was negative. The patient tolerated the procedure well. There were no operative complications. The patient was then transferred to the recovery room in stable condition.  Complications: None  Specimen: None  EBL: None  Prosthetic device: B&L enVista, MX60, power 21.0D,  SN [id number].

## 2013-10-28 NOTE — Transfer of Care (Signed)
Immediate Anesthesia Transfer of Care Note  Patient: Ryan Ibarra  Procedure(s) Performed: Procedure(s) with comments: CATARACT EXTRACTION PHACO AND INTRAOCULAR LENS PLACEMENT RIGHT EYE (Right) - CDE:  22.51  Patient Location: Short Stay  Anesthesia Type:MAC  Level of Consciousness: awake  Airway & Oxygen Therapy: Patient Spontanous Breathing  Post-op Assessment: Report given to PACU RN  Post vital signs: Reviewed  Complications: No apparent anesthesia complications

## 2013-10-28 NOTE — Anesthesia Postprocedure Evaluation (Signed)
  Anesthesia Post-op Note  Patient: Ryan Ibarra  Procedure(s) Performed: Procedure(s) with comments: CATARACT EXTRACTION PHACO AND INTRAOCULAR LENS PLACEMENT RIGHT EYE (Right) - CDE:  22.51  Patient Location: Short Stay  Anesthesia Type:MAC  Level of Consciousness: awake  Airway and Oxygen Therapy: Patient Spontanous Breathing  Post-op Pain: none  Post-op Assessment: Post-op Vital signs reviewed, Patient's Cardiovascular Status Stable, Respiratory Function Stable, Patent Airway and No signs of Nausea or vomiting  Post-op Vital Signs: Reviewed and stable  Complications: No apparent anesthesia complications

## 2013-10-31 ENCOUNTER — Encounter (HOSPITAL_COMMUNITY): Payer: Self-pay | Admitting: Ophthalmology

## 2014-05-07 NOTE — Progress Notes (Signed)
Arlee Muslim, PA  -  Please enter preop orders in Epic for [employer]  - surg date 6/8.  Thanks.

## 2014-05-09 ENCOUNTER — Encounter (HOSPITAL_COMMUNITY): Payer: Self-pay | Admitting: Pharmacy Technician

## 2014-05-12 ENCOUNTER — Other Ambulatory Visit: Payer: Self-pay | Admitting: Orthopedic Surgery

## 2014-05-14 NOTE — Patient Instructions (Signed)
Ryan Ibarra  05/14/2014   Your procedure is scheduled on:  05/26/2014  1030am-1120am  Report to [facility].  Follow the Signs to [place] at 0730       am  Call this number if you have problems the morning of surgery: [phone]   Remember:   Do not eat food or drink liquids after midnight.   Take these medicines the morning of surgery with A SIP OF WATER:    Do not wear jewelry,   Do not wear lotions, powders, or perfumes.    Men may shave face and neck.  Do not bring valuables to the hospital.  Contacts, dentures or bridgework may not be worn into surgery.  Leave suitcase in the car. After surgery it may be brought to your room.  For patients admitted to the hospital, checkout time is 11:00 AM the day of  discharge.   [place] - Preparing for Surgery Before surgery, you can play an important role.  Because skin is not sterile, your skin needs to be as free of germs as possible.  You can reduce the number of germs on your skin by washing with CHG (chlorahexidine gluconate) soap before surgery.  CHG is an antiseptic cleaner which kills germs and bonds with the skin to continue killing germs even after washing. Please DO NOT use if you have an allergy to CHG or antibacterial soaps.  If your skin becomes reddened/irritated stop using the CHG and inform your nurse when you arrive at Short Stay. Do not shave (including legs and underarms) for at least 48 hours prior to the first CHG shower.  You may shave your face/neck. Please follow these instructions carefully:  1.  Shower with CHG Soap the night before surgery and the  morning of Surgery.  2.  If you choose to wash your hair, wash your hair first as usual with your  normal  shampoo.  3.  After you shampoo, rinse your hair and body thoroughly to remove the  shampoo.                           4.  Use CHG as you would any other liquid soap.  You can apply chg directly  to the skin and wash   Gently with a scrungie or clean washcloth.  5.  Apply the CHG Soap to your body ONLY FROM THE NECK DOWN.   Do not use on face/ open                           Wound or open sores. Avoid contact with eyes, ears mouth and genitals (private parts).                       Wash face,  Genitals (private parts) with your normal soap.             6.  Wash thoroughly, paying special attention to the area where your surgery  will be performed.  7.  Thoroughly rinse your body with warm water from the neck down.  8.  DO NOT shower/wash with your normal soap after using and rinsing off  the CHG Soap.                9.  Pat yourself dry with a clean towel.  10.  Wear clean pajamas.            11.  Place clean sheets on your bed the night of your first shower and do not  sleep with pets. Day of Surgery : Do not apply any lotions/deodorants the morning of surgery.  Please wear clean clothes to the hospital/surgery center.  FAILURE TO FOLLOW THESE INSTRUCTIONS MAY RESULT IN THE CANCELLATION OF YOUR SURGERY PATIENT SIGNATURE_________________________________  NURSE SIGNATURE__________________________________  ________________________________________________________________________  WHAT IS A BLOOD TRANSFUSION? Blood Transfusion Information  A transfusion is the replacement of blood or some of its parts. Blood is made up of multiple cells which provide different functions.  Red blood cells carry oxygen and are used for blood loss replacement.  White blood cells fight against infection.  Platelets control bleeding.  Plasma helps clot blood.  Other blood products are available for specialized needs, such as hemophilia or other clotting disorders. BEFORE THE TRANSFUSION  Who gives blood for transfusions?   Healthy volunteers who are fully evaluated to make sure their blood is safe. This is blood bank blood. Transfusion therapy is the safest it has ever been in the practice of medicine. Before  blood is taken from a donor, a complete history is taken to make sure that person has no history of diseases nor engages in risky social behavior (examples are intravenous drug use or sexual activity with multiple partners). The donor's travel history is screened to minimize risk of transmitting infections, such as malaria. The donated blood is tested for signs of infectious diseases, such as HIV and hepatitis. The blood is then tested to be sure it is compatible with you in order to minimize the chance of a transfusion reaction. If you or a relative donates blood, this is often done in anticipation of surgery and is not appropriate for emergency situations. It takes many days to process the donated blood. RISKS AND COMPLICATIONS Although transfusion therapy is very safe and saves many lives, the main dangers of transfusion include:   Getting an infectious disease.  Developing a transfusion reaction. This is an allergic reaction to something in the blood you were given. Every precaution is taken to prevent this. The decision to have a blood transfusion has been considered carefully by your caregiver before blood is given. Blood is not given unless the benefits outweigh the risks. AFTER THE TRANSFUSION  Right after receiving a blood transfusion, you will usually feel much better and more energetic. This is especially true if your red blood cells have gotten low (anemic). The transfusion raises the level of the red blood cells which carry oxygen, and this usually causes an energy increase.  The nurse administering the transfusion will monitor you carefully for complications. HOME CARE INSTRUCTIONS  No special instructions are needed after a transfusion. You may find your energy is better. Speak with your caregiver about any limitations on activity for underlying diseases you may have. SEEK MEDICAL CARE IF:   Your condition is not improving after your transfusion.  You develop redness or irritation  at the intravenous (IV) site. SEEK IMMEDIATE MEDICAL CARE IF:  Any of the following symptoms occur over the next 12 hours:  Shaking chills.  You have a temperature by mouth above 102 F (38.9 C), not controlled by medicine.  Chest, back, or muscle pain.  People around you feel you are not acting correctly or are confused.  Shortness of breath or difficulty breathing.  Dizziness and fainting.  You get a rash or develop  hives.  You have a decrease in urine output.  Your urine turns a dark color or changes to pink, red, or brown. Any of the following symptoms occur over the next 10 days:  You have a temperature by mouth above 102 F (38.9 C), not controlled by medicine.  Shortness of breath.  Weakness after normal activity.  The white part of the eye turns yellow (jaundice).  You have a decrease in the amount of urine or are urinating less often.  Your urine turns a dark color or changes to pink, red, or brown. Document Released: 12/02/2000 Document Revised: 02/27/2012 Document Reviewed: 07/21/2008 Exit[employer] Patient Information 2014 [place].  _______________________________________________________________________  Incentive Spirometer  An incentive spirometer is a tool that can help keep your lungs clear and active. This tool measures how well you are filling your lungs with each breath. Taking long deep breaths may help reverse or decrease the chance of developing breathing (pulmonary) problems (especially infection) following:  A long period of time when you are unable to move or be active. BEFORE THE PROCEDURE   If the spirometer includes an indicator to show your best effort, your nurse or respiratory therapist will set it to a desired goal.  If possible, sit up straight or lean slightly forward. Try not to slouch.  Hold the incentive spirometer in an upright position. INSTRUCTIONS FOR USE  1. Sit on the edge of your bed if possible, or sit up as far as  you can in bed or on a chair. 2. Hold the incentive spirometer in an upright position. 3. Breathe out normally. 4. Place the mouthpiece in your mouth and seal your lips tightly around it. 5. Breathe in slowly and as deeply as possible, raising the piston or the ball toward the top of the column. 6. Hold your breath for 3-5 seconds or for as long as possible. Allow the piston or ball to fall to the bottom of the column. 7. Remove the mouthpiece from your mouth and breathe out normally. 8. Rest for a few seconds and repeat Steps 1 through 7 at least 10 times every 1-2 hours when you are awake. Take your time and take a few normal breaths between deep breaths. 9. The spirometer may include an indicator to show your best effort. Use the indicator as a goal to work toward during each repetition. 10. After each set of 10 deep breaths, practice coughing to be sure your lungs are clear. If you have an incision (the cut made at the time of surgery), support your incision when coughing by placing a pillow or rolled up towels firmly against it. Once you are able to get out of bed, walk around indoors and cough well. You may stop using the incentive spirometer when instructed by your caregiver.  RISKS AND COMPLICATIONS  Take your time so you do not get dizzy or light-headed.  If you are in pain, you may need to take or ask for pain medication before doing incentive spirometry. It is harder to take a deep breath if you are having pain. AFTER USE  Rest and breathe slowly and easily.  It can be helpful to keep track of a log of your progress. Your caregiver can provide you with a simple table to help with this. If you are using the spirometer at home, follow these instructions: [place] IF:   You are having difficultly using the spirometer.  You have trouble using the spirometer as often as instructed.  Your  pain medication is not giving enough relief while using the spirometer.  You develop  fever of 100.5 F (38.1 C) or higher. SEEK IMMEDIATE MEDICAL CARE IF:   You cough up bloody sputum that had not been present before.  You develop fever of 102 F (38.9 C) or greater.  You develop worsening pain at or near the incision site. MAKE SURE YOU:   Understand these instructions.  Will watch your condition.  Will get help right away if you are not doing well or get worse. Document Released: 04/17/2007 Document Revised: 02/27/2012 Document Reviewed: 06/18/2007 ExitCare Patient Information 2014 [employer], [place].   ________________________________________________________________________    Please read over the following fact sheets that you were given: MRSA Information, coughing and deep breathing exercises, leg exercises

## 2014-05-16 ENCOUNTER — Encounter (HOSPITAL_COMMUNITY)
Admission: RE | Admit: 2014-05-16 | Discharge: 2014-05-16 | Disposition: A | Discharge status: Home / Self Care | Payer: Medicare Other | Source: Ambulatory Visit | Attending: Orthopedic Surgery | Admitting: Orthopedic Surgery

## 2014-05-16 ENCOUNTER — Ambulatory Visit (HOSPITAL_COMMUNITY)
Admission: RE | Admit: 2014-05-16 | Discharge: 2014-05-16 | Disposition: A | Discharge status: Home / Self Care | Payer: Medicare Other | Source: Ambulatory Visit | Attending: Orthopedic Surgery | Admitting: Orthopedic Surgery

## 2014-05-16 ENCOUNTER — Encounter (HOSPITAL_COMMUNITY): Payer: Self-pay

## 2014-05-16 DIAGNOSIS — Z01811 Encounter for preprocedural respiratory examination: Secondary | ICD-10-CM | POA: Insufficient documentation

## 2014-05-16 DIAGNOSIS — Z01812 Encounter for preprocedural laboratory examination: Secondary | ICD-10-CM | POA: Insufficient documentation

## 2014-05-16 LAB — URINALYSIS, ROUTINE W REFLEX MICROSCOPIC
Bilirubin Urine: NEGATIVE
Glucose, UA: NEGATIVE mg/dL
Hgb urine dipstick: NEGATIVE
KETONES UR: NEGATIVE mg/dL
Leukocytes, UA: NEGATIVE
Nitrite: NEGATIVE
PH: 8 (ref 5.0–8.0)
Protein, ur: NEGATIVE mg/dL
SPECIFIC GRAVITY, URINE: 1.008 (ref 1.005–1.030)
Urobilinogen, UA: 0.2 mg/dL (ref 0.0–1.0)

## 2014-05-16 LAB — COMPREHENSIVE METABOLIC PANEL
ALBUMIN: 4.6 g/dL (ref 3.5–5.2)
ALT: 14 U/L (ref 0–53)
AST: 18 U/L (ref 0–37)
Alkaline Phosphatase: 83 U/L (ref 39–117)
BUN: 13 mg/dL (ref 6–23)
CO2: 30 mEq/L (ref 19–32)
CREATININE: 1.17 mg/dL (ref 0.50–1.35)
Calcium: 9.6 mg/dL (ref 8.4–10.5)
Chloride: 101 mEq/L (ref 96–112)
GFR calc Af Amer: 74 mL/min — ABNORMAL LOW (ref >90)
GFR calc non Af Amer: 64 mL/min — ABNORMAL LOW (ref >90)
Glucose, Bld: 89 mg/dL (ref 70–99)
Potassium: 4.5 mEq/L (ref 3.7–5.3)
Sodium: 141 mEq/L (ref 137–147)
TOTAL PROTEIN: 7.3 g/dL (ref 6.0–8.3)
Total Bilirubin: 0.3 mg/dL (ref 0.3–1.2)

## 2014-05-16 LAB — CBC
HCT: 39.6 % (ref 39.0–52.0)
Hemoglobin: 13.9 g/dL (ref 13.0–17.0)
MCH: 32.4 pg (ref 26.0–34.0)
MCHC: 35.1 g/dL (ref 30.0–36.0)
MCV: 92.3 fL (ref 78.0–100.0)
PLATELETS: 259 10*3/uL (ref 150–400)
RBC: 4.29 MIL/uL (ref 4.22–5.81)
RDW: 12.1 % (ref 11.5–15.5)
WBC: 6.3 10*3/uL (ref 4.0–10.5)

## 2014-05-16 LAB — SURGICAL PCR SCREEN
MRSA, PCR: NEGATIVE
STAPHYLOCOCCUS AUREUS: POSITIVE — AB

## 2014-05-16 LAB — APTT: aPTT: 30 seconds (ref 24–37)

## 2014-05-16 LAB — PROTIME-INR
INR: 0.95 (ref 0.00–1.49)
Prothrombin Time: 12.5 seconds (ref 11.6–15.2)

## 2014-05-16 NOTE — Progress Notes (Signed)
Last office visit with Dr Scotty Court (PCP) in [facility] - 04/17/2014 and on chart  12/18/13- Clearance from Dr Scotty Court on chart

## 2014-05-16 NOTE — Progress Notes (Signed)
Patient denied answering question regarding snuff or chewing tobacco and illegal drug use.  Patient stated" none of my business".

## 2014-05-20 ENCOUNTER — Other Ambulatory Visit: Payer: Self-pay | Admitting: Surgical

## 2014-05-20 NOTE — H&P (Signed)
TOTAL KNEE ADMISSION H&P  Patient is being admitted for right total knee arthroplasty.  Subjective:  Chief Complaint:right knee pain.  HPI: Ryan Ibarra, 68 y.o. male, has a history of pain and functional disability in the right knee due to arthritis and has failed non-surgical conservative treatments for greater than 12 weeks to includeNSAID's and/or analgesics, corticosteriod injections, flexibility and strengthening excercises and activity modification.  Onset of symptoms was gradual, starting 5 years ago with gradually worsening course since that time. The patient noted no past surgery on the right knee(s).  Patient currently rates pain in the right knee(s) at 8 out of 10 with activity. Patient has night pain, worsening of pain with activity and weight bearing, pain that interferes with activities of daily living, pain with passive range of motion, crepitus and joint swelling.  Patient has evidence of periarticular osteophytes and joint space narrowing by imaging studies. There is no active infection.  Patient Active Problem List   Diagnosis Date Noted  . Postoperative respiratory failure 06/20/2013  . Lumbar myelopathy 06/20/2013  . Cervical myelopathy 06/20/2013  . Lumbar spinal stenosis 06/20/2013  . S/P lumbar spinal fusion 06/20/2013  . S/P laminectomy with spinal fusion 06/20/2013   Past Medical History  Diagnosis Date  . Hypertension   . Anxiety   . History of kidney stones   . GERD (gastroesophageal reflux disease)   . Neuromuscular disorder     neuropathy   . Neuropathy   . Chronic back pain   . Depression   . Hyperlipidemia   . Arthritis     Past Surgical History  Procedure Laterality Date  . Hand / finger lesion excision Right 70's    ring finger rt reattached  . Tonsillectomy    . Lower back fusion    . Cataract extraction w/phaco Right 10/28/2013    Procedure: CATARACT EXTRACTION PHACO AND INTRAOCULAR LENS PLACEMENT RIGHT EYE;  Surgeon: Tonny Branch, MD;   Location: [facility];  Service: Ophthalmology;  Laterality: Right;  CDE:  22.51    Current outpatient prescriptions: aspirin 81 MG tablet, Take 81 mg by mouth every morning. , Disp: , Rfl: ;   DULoxetine (CYMBALTA) 60 MG capsule, Take 60 mg by mouth 2 (two) times daily., Disp: , Rfl: ;   fluticasone (FLONASE) 50 MCG/ACT nasal spray, Place 2 sprays into both nostrils daily., Disp: , Rfl: ;   gabapentin (NEURONTIN) 300 MG capsule, Take 600 mg by mouth 3 (three) times daily. , Disp: , Rfl:  lisinopril (PRINIVIL,ZESTRIL) 20 MG tablet, Take 20 mg by mouth at bedtime. , Disp: , Rfl: ;   meloxicam (MOBIC) 15 MG tablet, Take 15 mg by mouth at bedtime., Disp: , Rfl: ;   Multiple Vitamin (MULTIVITAMIN WITH MINERALS) TABS, Take 1 tablet by mouth daily., Disp: , Rfl: ;   oxyCODONE-acetaminophen (PERCOCET) 10-325 MG per tablet, Take 2 tablets by mouth every 6 (six) hours as needed for pain. , Disp: , Rfl:  PARoxetine (PAXIL) 20 MG tablet, Take 20 mg by mouth every morning., Disp: , Rfl: ;   ranitidine (ZANTAC) 150 MG tablet, Take 150 mg by mouth 2 (two) times daily., Disp: , Rfl: ;   simvastatin (ZOCOR) 40 MG tablet, Take 40 mg by mouth every evening., Disp: , Rfl: ;   traZODone (DESYREL) 50 MG tablet, Take 150 mg by mouth at bedtime., Disp: , Rfl:   No Known Allergies  History  Substance Use Topics  . Smoking status: Former Smoker -- 1.00 packs/day for  15 years    Types: Cigarettes    Quit date: 04/25/2010  . Smokeless tobacco: Never Used  . Alcohol Use: Yes     Comment: rare      Review of Systems  Constitutional: Negative.   HENT: Negative.   Eyes: Negative.   Respiratory: Negative.   Cardiovascular: Negative.   Gastrointestinal: Negative.   Genitourinary: Negative.   Musculoskeletal: Positive for back pain and joint pain. Negative for falls, myalgias and neck pain.       Right knee pain  Skin: Negative.   Neurological: Positive for tingling and sensory change. Negative for dizziness,  tremors, speech change, focal weakness, seizures and loss of consciousness.  Psychiatric/Behavioral: Negative.     Objective:  Physical Exam  Constitutional: He is oriented to person, place, and time. He appears well-developed and well-nourished. No distress.  HENT:  Head: Normocephalic and atraumatic.  Right Ear: External ear normal.  Left Ear: External ear normal.  Mouth/Throat: Oropharynx is clear and moist.  Eyes: Conjunctivae and EOM are normal.  Neck: Normal range of motion. Neck supple.  Cardiovascular: Normal rate, regular rhythm, normal heart sounds and intact distal pulses.   No murmur heard. Respiratory: Effort normal and breath sounds normal. No respiratory distress. He has no wheezes.  GI: Soft. Bowel sounds are normal. He exhibits no distension. There is no tenderness.  Musculoskeletal:       Right hip: Normal.       Left hip: Normal.       Right knee: He exhibits decreased range of motion and swelling. He exhibits no effusion and no erythema. Tenderness found. Medial joint line and lateral joint line tenderness noted.       Left knee: Normal.       Right lower leg: He exhibits no tenderness and no swelling.       Left lower leg: He exhibits no tenderness and no swelling.  Both hips have normal range of motion with no discomfort. Left knee exam is normal. Right knee, no effusion. He has a varus deformity. Range is about 5 to 125, moderate crepitus on range of motion, tenderness medial greater than lateral with no instability noted.  Neurological: He is alert and oriented to person, place, and time. He has normal strength and normal reflexes. No sensory deficit.  Skin: No rash noted. He is not diaphoretic. No erythema.  Psychiatric: He has a normal mood and affect. His behavior is normal.    Vitals Weight: 181 lb Height: 71 in Body Surface Area: 2.03 m Body Mass Index: 25.24 kg/m Pulse: 68 (Regular) BP: 132/78 (Sitting, Left Arm, Standard)  Imaging  Review Plain radiographs demonstrate severe degenerative joint disease of the right knee(s). The overall alignment ismild varus. The bone quality appears to be good for age and reported activity level.  Assessment/Plan:  End stage arthritis, right knee   The patient history, physical examination, clinical judgment of the provider and imaging studies are consistent with end stage degenerative joint disease of the right knee(s) and total knee arthroplasty is deemed medically necessary. The treatment options including medical management, injection therapy arthroscopy and arthroplasty were discussed at length. The risks and benefits of total knee arthroplasty were presented and reviewed. The risks due to aseptic loosening, infection, stiffness, patella tracking problems, thromboembolic complications and other imponderables were discussed. The patient acknowledged the explanation, agreed to proceed with the plan and consent was signed. Patient is being admitted for inpatient treatment for surgery, pain control, PT, OT, prophylactic antibiotics,  VTE prophylaxis, progressive ambulation and ADL's and discharge planning. The patient is planning to be discharged home with home health services (Patient prefers to move directly to outpatient therapy as he does not want anyone visiting his house)   Ardeen Jourdain, PA-C

## 2014-05-26 ENCOUNTER — Encounter (HOSPITAL_COMMUNITY): Payer: Medicare Other | Admitting: Anesthesiology

## 2014-05-26 ENCOUNTER — Encounter (HOSPITAL_COMMUNITY): Payer: Self-pay | Admitting: *Deleted

## 2014-05-26 ENCOUNTER — Inpatient Hospital Stay (HOSPITAL_COMMUNITY): Payer: Medicare Other | Admitting: Anesthesiology

## 2014-05-26 ENCOUNTER — Inpatient Hospital Stay (HOSPITAL_COMMUNITY)
Admission: RE | Admit: 2014-05-26 | Discharge: 2014-05-28 | DRG: 470 | Disposition: A | Discharge status: Home Health | Payer: Medicare Other | Source: Ambulatory Visit | Attending: Orthopedic Surgery | Admitting: Orthopedic Surgery

## 2014-05-26 ENCOUNTER — Encounter (HOSPITAL_COMMUNITY)
Admission: RE | Disposition: A | Discharge status: Home Health | Payer: Self-pay | Source: Ambulatory Visit | Attending: Orthopedic Surgery

## 2014-05-26 DIAGNOSIS — Z981 Arthrodesis status: Secondary | ICD-10-CM

## 2014-05-26 DIAGNOSIS — K219 Gastro-esophageal reflux disease without esophagitis: Secondary | ICD-10-CM | POA: Diagnosis present

## 2014-05-26 DIAGNOSIS — Z6825 Body mass index (BMI) 25.0-25.9, adult: Secondary | ICD-10-CM

## 2014-05-26 DIAGNOSIS — E785 Hyperlipidemia, unspecified: Secondary | ICD-10-CM | POA: Diagnosis present

## 2014-05-26 DIAGNOSIS — Z9849 Cataract extraction status, unspecified eye: Secondary | ICD-10-CM

## 2014-05-26 DIAGNOSIS — I10 Essential (primary) hypertension: Secondary | ICD-10-CM | POA: Diagnosis present

## 2014-05-26 DIAGNOSIS — F329 Major depressive disorder, single episode, unspecified: Secondary | ICD-10-CM | POA: Diagnosis present

## 2014-05-26 DIAGNOSIS — Z87442 Personal history of urinary calculi: Secondary | ICD-10-CM

## 2014-05-26 DIAGNOSIS — G8929 Other chronic pain: Secondary | ICD-10-CM | POA: Diagnosis present

## 2014-05-26 DIAGNOSIS — M179 Osteoarthritis of knee, unspecified: Secondary | ICD-10-CM | POA: Diagnosis present

## 2014-05-26 DIAGNOSIS — Z7982 Long term (current) use of aspirin: Secondary | ICD-10-CM

## 2014-05-26 DIAGNOSIS — Z96659 Presence of unspecified artificial knee joint: Secondary | ICD-10-CM

## 2014-05-26 DIAGNOSIS — M549 Dorsalgia, unspecified: Secondary | ICD-10-CM | POA: Diagnosis present

## 2014-05-26 DIAGNOSIS — Z87891 Personal history of nicotine dependence: Secondary | ICD-10-CM

## 2014-05-26 DIAGNOSIS — M1710 Unilateral primary osteoarthritis, unspecified knee: Principal | ICD-10-CM | POA: Diagnosis present

## 2014-05-26 DIAGNOSIS — Z79899 Other long term (current) drug therapy: Secondary | ICD-10-CM

## 2014-05-26 DIAGNOSIS — F3289 Other specified depressive episodes: Secondary | ICD-10-CM | POA: Diagnosis present

## 2014-05-26 HISTORY — PX: TOTAL KNEE ARTHROPLASTY: SHX125

## 2014-05-26 LAB — ABO/RH: ABO/RH(D): O POS

## 2014-05-26 LAB — TYPE AND SCREEN
ABO/RH(D): O POS
ANTIBODY SCREEN: NEGATIVE

## 2014-05-26 SURGERY — ARTHROPLASTY, KNEE, TOTAL
Anesthesia: General | Site: Knee | Laterality: Right

## 2014-05-26 MED ORDER — BUPIVACAINE HCL (PF) 0.25 % IJ SOLN
INTRAMUSCULAR | Status: AC
  Filled 2014-05-26: qty 30

## 2014-05-26 MED ORDER — DEXAMETHASONE SODIUM PHOSPHATE 10 MG/ML IJ SOLN: 10.0000 mg | Freq: Once | INTRAMUSCULAR | Status: DC

## 2014-05-26 MED ORDER — METOCLOPRAMIDE HCL 10 MG PO TABS: 5.0000 mg | ORAL_TABLET | Freq: Three times a day (TID) | ORAL | Status: DC | PRN

## 2014-05-26 MED ORDER — DEXAMETHASONE SODIUM PHOSPHATE 10 MG/ML IJ SOLN
10.0000 mg | Freq: Every day | INTRAMUSCULAR | Status: AC
  Filled 2014-05-26: qty 1

## 2014-05-26 MED ORDER — METOCLOPRAMIDE HCL 5 MG/ML IJ SOLN: 5.0000 mg | Freq: Three times a day (TID) | INTRAMUSCULAR | Status: DC | PRN

## 2014-05-26 MED ORDER — PAROXETINE HCL 20 MG PO TABS
20.0000 mg | ORAL_TABLET | Freq: Every day | ORAL | Status: DC
  Administered 2014-05-27 – 2014-05-28 (×2): 20 mg via ORAL
  Filled 2014-05-26 (×2): qty 1

## 2014-05-26 MED ORDER — SODIUM CHLORIDE 0.9 % IV SOLN
1000.0000 mg | INTRAVENOUS | Status: AC
  Administered 2014-05-26: 1000 mg via INTRAVENOUS
  Filled 2014-05-26: qty 10

## 2014-05-26 MED ORDER — FENTANYL CITRATE 0.05 MG/ML IJ SOLN
INTRAMUSCULAR | Status: DC | PRN
  Administered 2014-05-26: 100 ug via INTRAVENOUS
  Administered 2014-05-26 (×3): 50 ug via INTRAVENOUS

## 2014-05-26 MED ORDER — GABAPENTIN 300 MG PO CAPS
600.0000 mg | ORAL_CAPSULE | Freq: Three times a day (TID) | ORAL | Status: DC
  Administered 2014-05-26 – 2014-05-27 (×3): 600 mg via ORAL
  Filled 2014-05-26 (×5): qty 2

## 2014-05-26 MED ORDER — ACETAMINOPHEN 650 MG RE SUPP: 650.0000 mg | Freq: Four times a day (QID) | RECTAL | Status: DC | PRN

## 2014-05-26 MED ORDER — DEXAMETHASONE SODIUM PHOSPHATE 10 MG/ML IJ SOLN
INTRAMUSCULAR | Status: DC | PRN
  Administered 2014-05-26: 10 mg via INTRAVENOUS

## 2014-05-26 MED ORDER — SODIUM CHLORIDE 0.9 % IV SOLN
INTRAVENOUS | Status: DC
  Administered 2014-05-26 – 2014-05-27 (×2): via INTRAVENOUS

## 2014-05-26 MED ORDER — PROPOFOL 10 MG/ML IV BOLUS
INTRAVENOUS | Status: AC
  Filled 2014-05-26: qty 20

## 2014-05-26 MED ORDER — ACETAMINOPHEN 10 MG/ML IV SOLN
1000.0000 mg | Freq: Once | INTRAVENOUS | Status: DC
  Filled 2014-05-26: qty 100

## 2014-05-26 MED ORDER — SODIUM CHLORIDE 0.9 % IJ SOLN
INTRAMUSCULAR | Status: AC
  Filled 2014-05-26: qty 50

## 2014-05-26 MED ORDER — LACTATED RINGERS IV SOLN: INTRAVENOUS | Status: DC

## 2014-05-26 MED ORDER — CEFAZOLIN SODIUM-DEXTROSE 2-3 GM-% IV SOLR
2.0000 g | Freq: Four times a day (QID) | INTRAVENOUS | Status: AC
  Administered 2014-05-26 (×2): 2 g via INTRAVENOUS
  Filled 2014-05-26 (×3): qty 50

## 2014-05-26 MED ORDER — PROPOFOL 10 MG/ML IV BOLUS
INTRAVENOUS | Status: DC | PRN
  Administered 2014-05-26: 150 mg via INTRAVENOUS

## 2014-05-26 MED ORDER — 0.9 % SODIUM CHLORIDE (POUR BTL) OPTIME
TOPICAL | Status: DC | PRN
  Administered 2014-05-26: 1000 mL

## 2014-05-26 MED ORDER — BUPIVACAINE HCL 0.25 % IJ SOLN
INTRAMUSCULAR | Status: DC | PRN
  Administered 2014-05-26: 20 mL

## 2014-05-26 MED ORDER — DEXAMETHASONE 6 MG PO TABS
10.0000 mg | ORAL_TABLET | Freq: Every day | ORAL | Status: AC
  Administered 2014-05-27: 10 mg via ORAL
  Filled 2014-05-26: qty 1

## 2014-05-26 MED ORDER — CEFAZOLIN SODIUM-DEXTROSE 2-3 GM-% IV SOLR
2.0000 g | INTRAVENOUS | Status: AC
  Administered 2014-05-26: 2 g via INTRAVENOUS

## 2014-05-26 MED ORDER — HYDROMORPHONE HCL PF 1 MG/ML IJ SOLN
INTRAMUSCULAR | Status: DC | PRN
  Administered 2014-05-26 (×2): 1 mg via INTRAVENOUS

## 2014-05-26 MED ORDER — PROMETHAZINE HCL 25 MG/ML IJ SOLN: 6.2500 mg | INTRAMUSCULAR | Status: DC | PRN

## 2014-05-26 MED ORDER — FENTANYL CITRATE 0.05 MG/ML IJ SOLN
INTRAMUSCULAR | Status: AC
  Filled 2014-05-26: qty 5

## 2014-05-26 MED ORDER — ACETAMINOPHEN 500 MG PO TABS
1000.0000 mg | ORAL_TABLET | Freq: Four times a day (QID) | ORAL | Status: AC
  Administered 2014-05-26 – 2014-05-27 (×2): 1000 mg via ORAL
  Administered 2014-05-27: 500 mg via ORAL
  Administered 2014-05-27: 1000 mg via ORAL
  Filled 2014-05-26 (×4): qty 2

## 2014-05-26 MED ORDER — CEFAZOLIN SODIUM-DEXTROSE 2-3 GM-% IV SOLR
INTRAVENOUS | Status: AC
Start: 2014-05-26 — End: 2014-05-26
  Filled 2014-05-26: qty 50

## 2014-05-26 MED ORDER — FLEET ENEMA 7-19 GM/118ML RE ENEM: 1.0000 | ENEMA | Freq: Once | RECTAL | Status: AC | PRN

## 2014-05-26 MED ORDER — HYDROMORPHONE HCL PF 2 MG/ML IJ SOLN
INTRAMUSCULAR | Status: AC
  Filled 2014-05-26: qty 1

## 2014-05-26 MED ORDER — METHOCARBAMOL 500 MG PO TABS
500.0000 mg | ORAL_TABLET | Freq: Four times a day (QID) | ORAL | Status: DC | PRN
  Administered 2014-05-26 – 2014-05-28 (×7): 500 mg via ORAL
  Filled 2014-05-26 (×7): qty 1

## 2014-05-26 MED ORDER — ROCURONIUM BROMIDE 100 MG/10ML IV SOLN
INTRAVENOUS | Status: DC | PRN
Start: 2014-05-26 — End: 2014-05-26
  Administered 2014-05-26: 50 mg via INTRAVENOUS

## 2014-05-26 MED ORDER — SODIUM CHLORIDE 0.9 % IJ SOLN
INTRAMUSCULAR | Status: DC | PRN
  Administered 2014-05-26: 30 mL

## 2014-05-26 MED ORDER — DIPHENHYDRAMINE HCL 12.5 MG/5ML PO ELIX: 12.5000 mg | ORAL_SOLUTION | ORAL | Status: DC | PRN

## 2014-05-26 MED ORDER — ACETAMINOPHEN 325 MG PO TABS: 650.0000 mg | ORAL_TABLET | Freq: Four times a day (QID) | ORAL | Status: DC | PRN

## 2014-05-26 MED ORDER — DULOXETINE HCL 60 MG PO CPEP
60.0000 mg | ORAL_CAPSULE | Freq: Two times a day (BID) | ORAL | Status: DC
  Administered 2014-05-26 – 2014-05-28 (×4): 60 mg via ORAL
  Filled 2014-05-26 (×5): qty 1

## 2014-05-26 MED ORDER — GLYCOPYRROLATE 0.2 MG/ML IJ SOLN
INTRAMUSCULAR | Status: DC | PRN
  Administered 2014-05-26: 0.6 mg via INTRAVENOUS

## 2014-05-26 MED ORDER — SODIUM CHLORIDE 0.9 % IR SOLN
Status: DC | PRN
  Administered 2014-05-26: 1000 mL

## 2014-05-26 MED ORDER — NEOSTIGMINE METHYLSULFATE 10 MG/10ML IV SOLN
INTRAVENOUS | Status: DC | PRN
  Administered 2014-05-26: 4 mg via INTRAVENOUS

## 2014-05-26 MED ORDER — CHLORHEXIDINE GLUCONATE 4 % EX LIQD: 60.0000 mL | Freq: Once | CUTANEOUS | Status: DC

## 2014-05-26 MED ORDER — SIMVASTATIN 40 MG PO TABS
40.0000 mg | ORAL_TABLET | Freq: Every evening | ORAL | Status: DC
  Administered 2014-05-26 – 2014-05-27 (×2): 40 mg via ORAL
  Filled 2014-05-26 (×3): qty 1

## 2014-05-26 MED ORDER — FLUTICASONE PROPIONATE 50 MCG/ACT NA SUSP
2.0000 | Freq: Every day | NASAL | Status: DC
  Administered 2014-05-27 – 2014-05-28 (×2): 2 via NASAL
  Filled 2014-05-26: qty 16

## 2014-05-26 MED ORDER — ONDANSETRON HCL 4 MG PO TABS: 4.0000 mg | ORAL_TABLET | Freq: Four times a day (QID) | ORAL | Status: DC | PRN

## 2014-05-26 MED ORDER — BUPIVACAINE LIPOSOME 1.3 % IJ SUSP
20.0000 mL | Freq: Once | INTRAMUSCULAR | Status: DC
  Filled 2014-05-26: qty 20

## 2014-05-26 MED ORDER — TRAZODONE HCL 150 MG PO TABS
150.0000 mg | ORAL_TABLET | Freq: Every day | ORAL | Status: DC
  Administered 2014-05-26 – 2014-05-27 (×2): 150 mg via ORAL
  Filled 2014-05-26 (×3): qty 1

## 2014-05-26 MED ORDER — HYDROMORPHONE HCL PF 1 MG/ML IJ SOLN
INTRAMUSCULAR | Status: AC
  Filled 2014-05-26: qty 1

## 2014-05-26 MED ORDER — FAMOTIDINE 20 MG PO TABS
20.0000 mg | ORAL_TABLET | Freq: Two times a day (BID) | ORAL | Status: DC
  Administered 2014-05-27 – 2014-05-28 (×2): 20 mg via ORAL
  Filled 2014-05-26 (×4): qty 1

## 2014-05-26 MED ORDER — ONDANSETRON HCL 4 MG/2ML IJ SOLN
INTRAMUSCULAR | Status: DC | PRN
Start: 2014-05-26 — End: 2014-05-26
  Administered 2014-05-26: 4 mg via INTRAVENOUS

## 2014-05-26 MED ORDER — MENTHOL 3 MG MT LOZG
1.0000 | LOZENGE | OROMUCOSAL | Status: DC | PRN
  Filled 2014-05-26: qty 9

## 2014-05-26 MED ORDER — LACTATED RINGERS IV SOLN
INTRAVENOUS | Status: DC
  Administered 2014-05-26: 12:00:00 via INTRAVENOUS
  Administered 2014-05-26: 1000 mL via INTRAVENOUS

## 2014-05-26 MED ORDER — ONDANSETRON HCL 4 MG/2ML IJ SOLN: 4.0000 mg | Freq: Four times a day (QID) | INTRAMUSCULAR | Status: DC | PRN

## 2014-05-26 MED ORDER — MIDAZOLAM HCL 2 MG/2ML IJ SOLN
INTRAMUSCULAR | Status: AC
  Filled 2014-05-26: qty 2

## 2014-05-26 MED ORDER — SODIUM CHLORIDE 0.9 % IV SOLN: INTRAVENOUS | Status: DC

## 2014-05-26 MED ORDER — MORPHINE SULFATE 2 MG/ML IJ SOLN
1.0000 mg | INTRAMUSCULAR | Status: DC | PRN
  Administered 2014-05-26: 2 mg via INTRAVENOUS
  Administered 2014-05-26: 1 mg via INTRAVENOUS
  Administered 2014-05-26 – 2014-05-27 (×5): 2 mg via INTRAVENOUS
  Filled 2014-05-26 (×7): qty 1

## 2014-05-26 MED ORDER — PHENYLEPHRINE HCL 10 MG/ML IJ SOLN
INTRAMUSCULAR | Status: DC | PRN
  Administered 2014-05-26 (×2): 80 ug via INTRAVENOUS
  Administered 2014-05-26: 60 ug via INTRAVENOUS

## 2014-05-26 MED ORDER — ACETAMINOPHEN 10 MG/ML IV SOLN
INTRAVENOUS | Status: DC | PRN
  Administered 2014-05-26: 1000 mg via INTRAVENOUS

## 2014-05-26 MED ORDER — POLYETHYLENE GLYCOL 3350 17 G PO PACK: 17.0000 g | PACK | Freq: Every day | ORAL | Status: DC | PRN

## 2014-05-26 MED ORDER — DEXAMETHASONE SODIUM PHOSPHATE 10 MG/ML IJ SOLN
INTRAMUSCULAR | Status: AC
  Filled 2014-05-26: qty 1

## 2014-05-26 MED ORDER — HYDROMORPHONE HCL PF 1 MG/ML IJ SOLN
0.2500 mg | INTRAMUSCULAR | Status: DC | PRN
  Administered 2014-05-26 (×4): 0.5 mg via INTRAVENOUS

## 2014-05-26 MED ORDER — PHENOL 1.4 % MT LIQD
1.0000 | OROMUCOSAL | Status: DC | PRN
  Filled 2014-05-26: qty 177

## 2014-05-26 MED ORDER — MIDAZOLAM HCL 5 MG/5ML IJ SOLN
INTRAMUSCULAR | Status: DC | PRN
  Administered 2014-05-26: 2 mg via INTRAVENOUS

## 2014-05-26 MED ORDER — DEXTROSE 5 % IV SOLN
500.0000 mg | Freq: Four times a day (QID) | INTRAVENOUS | Status: DC | PRN
  Administered 2014-05-26: 500 mg via INTRAVENOUS
  Filled 2014-05-26: qty 5

## 2014-05-26 MED ORDER — DOCUSATE SODIUM 100 MG PO CAPS
100.0000 mg | ORAL_CAPSULE | Freq: Two times a day (BID) | ORAL | Status: DC
  Administered 2014-05-26: 100 mg via ORAL

## 2014-05-26 MED ORDER — BISACODYL 10 MG RE SUPP: 10.0000 mg | Freq: Every day | RECTAL | Status: DC | PRN

## 2014-05-26 MED ORDER — GLYCOPYRROLATE 0.2 MG/ML IJ SOLN
INTRAMUSCULAR | Status: AC
  Filled 2014-05-26: qty 3

## 2014-05-26 MED ORDER — RIVAROXABAN 10 MG PO TABS
10.0000 mg | ORAL_TABLET | Freq: Every day | ORAL | Status: DC
  Administered 2014-05-27 – 2014-05-28 (×2): 10 mg via ORAL
  Filled 2014-05-26 (×3): qty 1

## 2014-05-26 MED ORDER — OXYCODONE HCL 5 MG PO TABS
5.0000 mg | ORAL_TABLET | ORAL | Status: DC | PRN
  Administered 2014-05-26: 10 mg via ORAL
  Administered 2014-05-26: 20 mg via ORAL
  Administered 2014-05-26: 10 mg via ORAL
  Administered 2014-05-26 – 2014-05-28 (×12): 20 mg via ORAL
  Filled 2014-05-26 (×6): qty 4
  Filled 2014-05-26: qty 2
  Filled 2014-05-26 (×2): qty 4
  Filled 2014-05-26: qty 2
  Filled 2014-05-26 (×5): qty 4

## 2014-05-26 MED ORDER — BUPIVACAINE LIPOSOME 1.3 % IJ SUSP
INTRAMUSCULAR | Status: DC | PRN
  Administered 2014-05-26: 20 mL

## 2014-05-26 MED ORDER — STERILE WATER FOR IRRIGATION IR SOLN
Status: DC | PRN
  Administered 2014-05-26: 1500 mL

## 2014-05-26 SURGICAL SUPPLY — 58 items
BAG ZIPLOCK 12X15 (MISCELLANEOUS) ×2 IMPLANT
BANDAGE ELASTIC 6 VELCRO ST LF (GAUZE/BANDAGES/DRESSINGS) ×2 IMPLANT
BANDAGE ESMARK 6X9 LF (GAUZE/BANDAGES/DRESSINGS) ×1 IMPLANT
BLADE SAG 18X100X1.27 (BLADE) ×2 IMPLANT
BLADE SAW SGTL 11.0X1.19X90.0M (BLADE) ×2 IMPLANT
BNDG ESMARK 6X9 LF (GAUZE/BANDAGES/DRESSINGS) ×2
BOWL SMART MIX CTS (DISPOSABLE) ×2 IMPLANT
CAPT RP KNEE ×2 IMPLANT
CEMENT HV SMART SET (Cement) ×4 IMPLANT
CUFF TOURN SGL QUICK 34 (TOURNIQUET CUFF) ×1
CUFF TRNQT CYL 34X4X40X1 (TOURNIQUET CUFF) ×1 IMPLANT
DECANTER SPIKE VIAL GLASS SM (MISCELLANEOUS) ×2 IMPLANT
DRAPE EXTREMITY T 121X128X90 (DRAPE) ×2 IMPLANT
DRAPE POUCH INSTRU U-SHP 10X18 (DRAPES) ×2 IMPLANT
DRAPE U-SHAPE 47X51 STRL (DRAPES) ×2 IMPLANT
DRSG ADAPTIC 3X8 NADH LF (GAUZE/BANDAGES/DRESSINGS) ×2 IMPLANT
DRSG PAD ABDOMINAL 8X10 ST (GAUZE/BANDAGES/DRESSINGS) ×2 IMPLANT
DURAPREP 26ML APPLICATOR (WOUND CARE) ×2 IMPLANT
ELECT REM PT RETURN 9FT ADLT (ELECTROSURGICAL) ×2
ELECTRODE REM PT RTRN 9FT ADLT (ELECTROSURGICAL) ×1 IMPLANT
EVACUATOR 1/8 PVC DRAIN (DRAIN) ×2 IMPLANT
FACESHIELD WRAPAROUND (MASK) ×10 IMPLANT
GLOVE BIO SURGEON STRL SZ7.5 (GLOVE) IMPLANT
GLOVE BIO SURGEON STRL SZ8 (GLOVE) ×2 IMPLANT
GLOVE BIOGEL PI IND STRL 6.5 (GLOVE) IMPLANT
GLOVE BIOGEL PI IND STRL 8 (GLOVE) ×1 IMPLANT
GLOVE BIOGEL PI INDICATOR 6.5 (GLOVE)
GLOVE BIOGEL PI INDICATOR 8 (GLOVE) ×1
GLOVE SURG SS PI 6.5 STRL IVOR (GLOVE) IMPLANT
GOWN STRL REUS W/TWL LRG LVL3 (GOWN DISPOSABLE) ×2 IMPLANT
GOWN STRL REUS W/TWL XL LVL3 (GOWN DISPOSABLE) IMPLANT
HANDPIECE INTERPULSE COAX TIP (DISPOSABLE) ×1
IMMOBILIZER KNEE 20 (SOFTGOODS) ×4 IMPLANT
IMMOBILIZER KNEE 20 THIGH 36 (SOFTGOODS) ×1 IMPLANT
KIT BASIN OR (CUSTOM PROCEDURE TRAY) ×2 IMPLANT
MANIFOLD NEPTUNE II (INSTRUMENTS) ×2 IMPLANT
NDL SAFETY ECLIPSE 18X1.5 (NEEDLE) ×2 IMPLANT
NEEDLE HYPO 18GX1.5 SHARP (NEEDLE) ×2
NS IRRIG 1000ML POUR BTL (IV SOLUTION) ×2 IMPLANT
PACK TOTAL JOINT (CUSTOM PROCEDURE TRAY) ×2 IMPLANT
PADDING CAST COTTON 6X4 STRL (CAST SUPPLIES) ×4 IMPLANT
POSITIONER SURGICAL ARM (MISCELLANEOUS) ×2 IMPLANT
SET HNDPC FAN SPRY TIP SCT (DISPOSABLE) ×1 IMPLANT
SPONGE GAUZE 4X4 12PLY (GAUZE/BANDAGES/DRESSINGS) ×2 IMPLANT
STRIP CLOSURE SKIN 1/2X4 (GAUZE/BANDAGES/DRESSINGS) ×4 IMPLANT
SUCTION FRAZIER 12FR DISP (SUCTIONS) ×2 IMPLANT
SUT MNCRL AB 4-0 PS2 18 (SUTURE) ×2 IMPLANT
SUT VIC AB 2-0 CT1 27 (SUTURE) ×3
SUT VIC AB 2-0 CT1 TAPERPNT 27 (SUTURE) ×3 IMPLANT
SUT VLOC 180 0 24IN GS25 (SUTURE) ×2 IMPLANT
SYR 20CC LL (SYRINGE) ×2 IMPLANT
SYR 50ML LL SCALE MARK (SYRINGE) ×2 IMPLANT
TOWEL OR 17X26 10 PK STRL BLUE (TOWEL DISPOSABLE) ×2 IMPLANT
TOWEL OR NON WOVEN STRL DISP B (DISPOSABLE) IMPLANT
TRAY FOLEY CATH 14FRSI W/METER (CATHETERS) IMPLANT
TRAY FOLEY CATH 16FR SILVER (SET/KITS/TRAYS/PACK) ×2 IMPLANT
WATER STERILE IRR 1500ML POUR (IV SOLUTION) ×2 IMPLANT
WRAP KNEE MAXI GEL POST OP (GAUZE/BANDAGES/DRESSINGS) ×2 IMPLANT

## 2014-05-26 NOTE — Transfer of Care (Signed)
Immediate Anesthesia Transfer of Care Note  Patient: Ryan Ibarra  Procedure(s) Performed: Procedure(s) (LRB): RIGHT TOTAL KNEE ARTHROPLASTY (Right)  Patient Location: PACU  Anesthesia Type: General  Level of Consciousness: sedated, patient cooperative and responds to stimulation  Airway & Oxygen Therapy: Patient Spontanous Breathing and Patient connected to face mask oxgen  Post-op Assessment: Report given to PACU RN and Post -op Vital signs reviewed and stable  Post vital signs: Reviewed and stable  Complications: No apparent anesthesia complications

## 2014-05-26 NOTE — Progress Notes (Signed)
Utilization review completed.  

## 2014-05-26 NOTE — Interval H&P Note (Signed)
History and Physical Interval Note:  05/26/2014 9:51 AM  Ryan Ibarra  has presented today for surgery, with the diagnosis of OA RIGHT KNEE  The various methods of treatment have been discussed with the patient and family. After consideration of risks, benefits and other options for treatment, the patient has consented to  Procedure(s): RIGHT TOTAL KNEE ARTHROPLASTY (Right) as a surgical intervention .  The patient's history has been reviewed, patient examined, no change in status, stable for surgery.  I have reviewed the patient's chart and labs.  Questions were answered to the patient's satisfaction.     Pilar Plate  V

## 2014-05-26 NOTE — Anesthesia Preprocedure Evaluation (Addendum)
Anesthesia Evaluation  Patient identified by MRN, date of birth, ID band Patient awake    Reviewed: Allergy & Precautions, H&P , NPO status , Patient's Chart, lab work & pertinent test results  History of Anesthesia Complications (+) history of anesthetic complications (History of respiratory failure following general anesthesia.)  Airway Mallampati: II TM Distance: >3 FB Neck ROM: Full    Dental  (+) Edentulous Upper, Edentulous Lower   Pulmonary former smoker,  breath sounds clear to auscultation  Pulmonary exam normal       Cardiovascular hypertension, Pt. on medications Rhythm:Regular Rate:Normal     Neuro/Psych Anxiety Depression Chronic back pain, lumbar spinal fusion L2-L5.  Neuromuscular disease negative psych ROS   GI/Hepatic Neg liver ROS, GERD-  Medicated,  Endo/Other  negative endocrine ROS  Renal/GU negative Renal ROS  negative genitourinary   Musculoskeletal negative musculoskeletal ROS (+)   Abdominal   Peds  Hematology negative hematology ROS (+)   Anesthesia Other Findings   Reproductive/Obstetrics                         Anesthesia Physical Anesthesia Plan  ASA: II  Anesthesia Plan: General   Post-op Pain Management:    Induction: Intravenous  Airway Management Planned: Oral ETT  Additional Equipment:   Intra-op Plan:   Post-operative Plan: Extubation in OR  Informed Consent: I have reviewed the patients History and Physical, chart, labs and discussed the procedure including the risks, benefits and alternatives for the proposed anesthesia with the patient or authorized representative who has indicated his/her understanding and acceptance.   Dental advisory given  Plan Discussed with: CRNA  Anesthesia Plan Comments:         Anesthesia Quick Evaluation

## 2014-05-26 NOTE — Anesthesia Postprocedure Evaluation (Signed)
Anesthesia Post Note  Patient: Ryan Ibarra  Procedure(s) Performed: Procedure(s) (LRB): RIGHT TOTAL KNEE ARTHROPLASTY (Right)  Anesthesia type: General  Patient location: PACU  Post pain: Pain level controlled  Post assessment: Post-op Vital signs reviewed  Last Vitals:  Filed Vitals:   05/26/14 1456  BP: 107/69  Pulse: 84  Temp: 36.2 C  Resp: 16    Post vital signs: Reviewed  Level of consciousness: sedated  Complications: No apparent anesthesia complications

## 2014-05-26 NOTE — Plan of Care (Signed)
Problem: Consults Goal: Diagnosis- Total Joint Replacement Right total knee     

## 2014-05-26 NOTE — Op Note (Signed)
Pre-operative diagnosis- Osteoarthritis  Right knee(s)  Post-operative diagnosis- Osteoarthritis Right knee(s)  Procedure-  Right  Total Knee Arthroplasty  Surgeon- Dione Plover. , MD  Assistant- Ardeen Jourdain, PA-C   Anesthesia-  General  EBL-* No blood loss amount entered *   Drains Hemovac  Tourniquet time-  Total Tourniquet Time Documented: Thigh (Right) - 36 minutes Total: Thigh (Right) - 36 minutes     Complications- None  Condition-PACU - hemodynamically stable.   Brief Clinical Note  Ryan Ibarra is a 68 y.o. year old male with end stage OA of his right knee with progressively worsening pain and dysfunction. He has constant pain, with activity and at rest and significant functional deficits with difficulties even with ADLs. He has had extensive non-op management including analgesics, injections of cortisone, and home exercise program, but remains in significant pain with significant dysfunction. Radiographs show bone on bone arthritis medial and patellofemoral. He presents now for right Total Knee Arthroplasty.    Procedure in detail---   The patient is brought into the operating room and positioned supine on the operating table. After successful administration of  General,   a tourniquet is placed high on the  Right thigh(s) and the lower extremity is prepped and draped in the usual sterile fashion. Time out is performed by the operating team and then the  Right lower extremity is wrapped in Esmarch, knee flexed and the tourniquet inflated to 300 mmHg.       A midline incision is made with a ten blade through the subcutaneous tissue to the level of the extensor mechanism. A fresh blade is used to make a medial parapatellar arthrotomy. Soft tissue over the proximal medial tibia is subperiosteally elevated to the joint line with a knife and into the semimembranosus bursa with a Cobb elevator. Soft tissue over the proximal lateral tibia is elevated with attention being paid  to avoiding the patellar tendon on the tibial tubercle. The patella is everted, knee flexed 90 degrees and the ACL and PCL are removed. Findings are bone on bone medial and patellofemoral with large global osteophytes.        The drill is used to create a starting hole in the distal femur and the canal is thoroughly irrigated with sterile saline to remove the fatty contents. The 5 degree Right  valgus alignment guide is placed into the femoral canal and the distal femoral cutting block is pinned to remove 11 mm off the distal femur. Resection is made with an oscillating saw.      The tibia is subluxed forward and the menisci are removed. The extramedullary alignment guide is placed referencing proximally at the medial aspect of the tibial tubercle and distally along the second metatarsal axis and tibial crest. The block is pinned to remove 64mm off the more deficient medial  side. Resection is made with an oscillating saw. Size 5is the most appropriate size for the tibia and the proximal tibia is prepared with the modular drill and keel punch for that size.      The femoral sizing guide is placed and size 4 is most appropriate. Rotation is marked off the epicondylar axis and confirmed by creating a rectangular flexion gap at 90 degrees. The size 4 cutting block is pinned in this rotation and the anterior, posterior and chamfer cuts are made with the oscillating saw. The intercondylar block is then placed and that cut is made.      Trial size 5 tibial component, trial size  4 posterior stabilized femur and a 15  mm posterior stabilized rotating platform insert trial is placed. Full extension is achieved with excellent varus/valgus and anterior/posterior balance throughout full range of motion. The patella is everted and thickness measured to be 27  mm. Free hand resection is taken to 15 mm, a 41 template is placed, lug holes are drilled, trial patella is placed, and it tracks normally. Osteophytes are removed off  the posterior femur with the trial in place. All trials are removed and the cut bone surfaces prepared with pulsatile lavage. Cement is mixed and once ready for implantation, the size 5 tibial implant, size  4 posterior stabilized femoral component, and the size 41 patella are cemented in place and the patella is held with the clamp. The trial insert is placed and the knee held in full extension. The Exparel (20 ml mixed with 30 ml saline) and .25% Bupivicaine, are injected into the extensor mechanism, posterior capsule, medial and lateral gutters and subcutaneous tissues.  All extruded cement is removed and once the cement is hard the permanent 15 mm posterior stabilized rotating platform insert is placed into the tibial tray.      The wound is copiously irrigated with saline solution and the extensor mechanism closed over a hemovac drain with #1 V-loc suture. The tourniquet is released for a total tourniquet time of 36  minutes. Flexion against gravity is 140 degrees and the patella tracks normally. Subcutaneous tissue is closed with 2.0 vicryl and subcuticular with running 4.0 Monocryl. The incision is cleaned and dried and steri-strips and a bulky sterile dressing are applied. The limb is placed into a knee immobilizer and the patient is awakened and transported to recovery in stable condition.      Please note that a surgical assistant was a medical necessity for this procedure in order to perform it in a safe and expeditious manner. Surgical assistant was necessary to retract the ligaments and vital neurovascular structures to prevent injury to them and also necessary for proper positioning of the limb to allow for anatomic placement of the prosthesis.   Dione Plover , MD    05/26/2014, 11:35 AM

## 2014-05-27 LAB — CBC
HEMATOCRIT: 31.7 % — AB (ref 39.0–52.0)
HEMOGLOBIN: 11.3 g/dL — AB (ref 13.0–17.0)
MCH: 32.6 pg (ref 26.0–34.0)
MCHC: 35.6 g/dL (ref 30.0–36.0)
MCV: 91.4 fL (ref 78.0–100.0)
Platelets: 231 10*3/uL (ref 150–400)
RBC: 3.47 MIL/uL — ABNORMAL LOW (ref 4.22–5.81)
RDW: 12.3 % (ref 11.5–15.5)
WBC: 14.1 10*3/uL — AB (ref 4.0–10.5)

## 2014-05-27 LAB — BASIC METABOLIC PANEL
BUN: 14 mg/dL (ref 6–23)
CHLORIDE: 102 meq/L (ref 96–112)
CO2: 26 mEq/L (ref 19–32)
Calcium: 8.5 mg/dL (ref 8.4–10.5)
Creatinine, Ser: 0.93 mg/dL (ref 0.50–1.35)
GFR, EST NON AFRICAN AMERICAN: 86 mL/min — AB (ref >90)
GLUCOSE: 150 mg/dL — AB (ref 70–99)
POTASSIUM: 4.1 meq/L (ref 3.7–5.3)
Sodium: 138 mEq/L (ref 137–147)

## 2014-05-27 MED ORDER — OXYCODONE HCL 5 MG PO TABS: 5.0000 mg | ORAL_TABLET | ORAL | Status: DC | PRN

## 2014-05-27 MED ORDER — METHOCARBAMOL 500 MG PO TABS: 500.0000 mg | ORAL_TABLET | Freq: Four times a day (QID) | ORAL | Status: DC | PRN

## 2014-05-27 MED ORDER — GABAPENTIN 300 MG PO CAPS
900.0000 mg | ORAL_CAPSULE | Freq: Three times a day (TID) | ORAL | Status: DC
  Administered 2014-05-27: 300 mg via ORAL
  Administered 2014-05-27 – 2014-05-28 (×3): 900 mg via ORAL
  Filled 2014-05-27 (×5): qty 3

## 2014-05-27 MED ORDER — DSS 100 MG PO CAPS: 100.0000 mg | ORAL_CAPSULE | Freq: Two times a day (BID) | ORAL | Status: DC

## 2014-05-27 MED ORDER — RIVAROXABAN 10 MG PO TABS: 10.0000 mg | ORAL_TABLET | Freq: Every day | ORAL | Status: DC

## 2014-05-27 MED ORDER — HYDROMORPHONE HCL PF 1 MG/ML IJ SOLN
0.5000 mg | INTRAMUSCULAR | Status: DC | PRN
  Administered 2014-05-27 (×3): 1 mg via INTRAVENOUS
  Filled 2014-05-27 (×3): qty 1

## 2014-05-27 NOTE — Progress Notes (Signed)
   Subjective: 1 Day Post-Op Procedure(s) (LRB): RIGHT TOTAL KNEE ARTHROPLASTY (Right) Patient reports pain as moderate.   Patient seen in rounds with Dr. Wynelle Link. Patient is well, and has had no acute complaints or problems other than poor pain control. He reports that he got about 3 hours last night. No complications overnight. No SOB or chest pain.  We will start therapy today.  Plan is to go Home after hospital stay.  Objective: Vital signs in last 24 hours: Temp:  [97 F (36.1 C)-99.5 F (37.5 C)] 98.5 F (36.9 C) (06/09 0553) Pulse Rate:  [61-102] 61 (06/09 0553) Resp:  [13-18] 16 (06/09 0553) BP: (98-150)/(51-83) 98/61 mmHg (06/09 0553) SpO2:  [95 %-100 %] 100 % (06/09 0553) Weight:  [81.194 kg (179 lb)] 81.194 kg (179 lb) (06/08 1310)  Intake/Output from previous day:  Intake/Output Summary (Last 24 hours) at 05/27/14 0719 Last data filed at 05/27/14 0600  Gross per 24 hour  Intake 4963.34 ml  Output   2950 ml  Net 2013.34 ml     Labs:  Recent Labs  05/27/14 0435  HGB 11.3*    Recent Labs  05/27/14 0435  WBC 14.1*  RBC 3.47*  HCT 31.7*  PLT 231    Recent Labs  05/27/14 0435  NA 138  K 4.1  CL 102  CO2 26  BUN 14  CREATININE 0.93  GLUCOSE 150*  CALCIUM 8.5    EXAM General - Patient is Alert and Oriented Extremity - Neurologically intact Intact pulses distally Dorsiflexion/Plantar flexion intact Compartment soft Dressing - dressing C/D/I Motor Function - intact, moving foot and toes well on exam.  Hemovac pulled without difficulty.  Past Medical History  Diagnosis Date  . Hypertension   . Anxiety   . History of kidney stones   . GERD (gastroesophageal reflux disease)   . Neuromuscular disorder     neuropathy   . Neuropathy   . Chronic back pain   . Depression   . Hyperlipidemia   . Arthritis     Assessment/Plan: 1 Day Post-Op Procedure(s) (LRB): RIGHT TOTAL KNEE ARTHROPLASTY (Right) Principal Problem:   OA  (osteoarthritis) of knee  Estimated body mass index is 25.68 kg/(m^2) as calculated from the following:   Height as of this encounter: 5\' 10"  (1.778 m).   Weight as of this encounter: 81.194 kg (179 lb). Advance diet Up with therapy D/C IV fluids when POs tolerated  DVT Prophylaxis - Xarelto Weight-Bearing as tolerated  D/C O2 and Pulse OX and try on Room Air  Will start therapy today. Elevated WBC this morning but asymptomatic. Will continue to monitor. Encourage incentive spirometer. DC foley.   Ardeen Jourdain, PA-C Orthopaedic Surgery 05/27/2014, 7:19 AM

## 2014-05-27 NOTE — Discharge Instructions (Addendum)
° °Dr.   °Total Joint Specialist °[facility] °[address] °[place], [place] [address] °[phone] ° °TOTAL KNEE REPLACEMENT POSTOPERATIVE DIRECTIONS ° ° ° °Knee Rehabilitation, Guidelines Following Surgery  °Results after knee surgery are often greatly improved when you follow the exercise, range of motion and muscle strengthening exercises prescribed by your doctor. Safety measures are also important to protect the knee from further injury. Any time any of these exercises cause you to have increased pain or swelling in your knee joint, decrease the amount until you are comfortable again and slowly increase them. If you have problems or questions, call your caregiver or physical therapist for advice.  ° °HOME CARE INSTRUCTIONS  °Remove items at home which could result in a fall. This includes throw rugs or furniture in walking pathways.  °Continue medications as instructed at time of discharge. °You may have some home medications which will be placed on hold until you complete the course of blood thinner medication.  °You may start showering once you are discharged home but do not submerge the incision under water. Just pat the incision dry and apply a dry gauze dressing on daily. °Walk with walker as instructed.  °You may resume a sexual relationship in one month or when given the OK by  your doctor.  °· Use walker as long as suggested by your caregivers. °· Avoid periods of inactivity such as sitting longer than an hour when not asleep. This helps prevent blood clots.  °You may put full weight on your legs and walk as much as is comfortable.  °You may return to work once you are cleared by your doctor.  °Do not drive a car for 6 weeks or until released by you surgeon.  °· Do not drive while taking narcotics.  °Wear the elastic stockings for three weeks following surgery during the day but you may remove then at night. °Make sure you keep all of your appointments after your  operation with all of your doctors and caregivers. You should call the office at the above phone number and make an appointment for approximately two weeks after the date of your surgery. °Change the dressing daily and reapply a dry dressing each time. °Please pick up a stool softener and laxative for home use as long as you are requiring pain medications. °· Continue to use ice on the knee for pain and swelling from surgery. You may notice swelling that will progress down to the foot and ankle.  This is normal after surgery.  Elevate the leg when you are not up walking on it.   °It is important for you to complete the blood thinner medication as prescribed by your doctor. °· Continue to use the breathing machine which will help keep your temperature down.  It is common for your temperature to cycle up and down following surgery, especially at night when you are not up moving around and exerting yourself.  The breathing machine keeps your lungs expanded and your temperature down. ° °RANGE OF MOTION AND STRENGTHENING EXERCISES  °Rehabilitation of the knee is important following a knee injury or an operation. After just a few days of immobilization, the muscles of the thigh which control the knee become weakened and shrink (atrophy). Knee exercises are designed to build up the tone and strength of the thigh muscles and to improve knee motion. Often times heat used for twenty to thirty minutes before working out will loosen up your tissues and help with improving the   range of motion but do not use heat for the first two weeks following surgery. These exercises can be done on a training (exercise) mat, on the floor, on a table or on a bed. Use what ever works the best and is most comfortable for you Knee exercises include:  Leg Lifts - While your knee is still immobilized in a splint or cast, you can do straight leg raises. Lift the leg to 60 degrees, hold for 3 sec, and slowly lower the leg. Repeat 10-20 times 2-3  times daily. Perform this exercise against resistance later as your knee gets better.  Quad and Hamstring Sets - Tighten up the muscle on the front of the thigh (Quad) and hold for 5-10 sec. Repeat this 10-20 times hourly. Hamstring sets are done by pushing the foot backward against an object and holding for 5-10 sec. Repeat as with quad sets.  A rehabilitation program following serious knee injuries can speed recovery and prevent re-injury in the future due to weakened muscles. Contact your doctor or a physical therapist for more information on knee rehabilitation.   SKILLED REHAB INSTRUCTIONS: If the patient is transferred to a skilled rehab facility following release from the hospital, a list of the current medications will be sent to the facility for the patient to continue.  When discharged from the skilled rehab facility, please have the facility set up the patient's [place] prior to being released. Also, the skilled facility will be responsible for providing the patient with their medications at time of release from the facility to include their pain medication, the muscle relaxants, and their blood thinner medication. If the patient is still at the rehab facility at time of the two week follow up appointment, the skilled rehab facility will also need to assist the patient in arranging follow up appointment in our office and any transportation needs.  MAKE SURE YOU:  Understand these instructions.  Will watch your condition.  Will get help right away if you are not doing well or get worse.    Pick up stool softner and laxative for home. Do not submerge incision under water. May shower. Continue to use ice for pain and swelling from surgery.             Information on my medicine - XARELTO (Rivaroxaban)  This medication education was reviewed with me or my healthcare representative as part of my discharge preparation.  The pharmacist that spoke with me  during my hospital stay was:  Kara Mead, [facility]  Why was Xarelto prescribed for you? Xarelto was prescribed for you to reduce the risk of blood clots forming after orthopedic surgery. The medical term for these abnormal blood clots is venous thromboembolism (VTE).  What do you need to know about xarelto ? Take your Xarelto ONCE DAILY at the same time every day. You may take it either with or without food.  If you have difficulty swallowing the tablet whole, you may crush it and mix in applesauce just prior to taking your dose.  Take Xarelto exactly as prescribed by your doctor and DO NOT stop taking Xarelto without talking to the doctor who prescribed the medication.  Stopping without other VTE prevention medication to take the place of Xarelto may increase your risk of developing a clot.  After discharge, you should have regular check-up appointments with your healthcare provider that is prescribing your Xarelto.    What do you do if you miss a dose? If you  miss a dose, take it as soon as you remember on the same day then continue your regularly scheduled once daily regimen the next day. Do not take two doses of Xarelto on the same day.   Important Safety Information A possible side effect of Xarelto is bleeding. You should call your healthcare provider right away if you experience any of the following:   Bleeding from an injury or your nose that does not stop.   Unusual colored urine (red or dark brown) or unusual colored stools (red or black).   Unusual bruising for unknown reasons.   A serious fall or if you hit your head (even if there is no bleeding).  Some medicines may interact with Xarelto and might increase your risk of bleeding while on Xarelto. To help avoid this, consult your healthcare provider or pharmacist prior to using any new prescription or non-prescription medications, including herbals, vitamins, non-steroidal anti-inflammatory drugs (NSAIDs) and  supplements.  This website has more information on Xarelto: [email or web address]

## 2014-05-27 NOTE — Progress Notes (Signed)
OT Cancellation Note  Patient Details Name: JIAIRE ROSEBROOK MRN: [id number] DOB: 27-Aug-1946   Cancelled Treatment:    Reason Eval/Treat Not Completed: Other (comment).  Pt just back to bed.  Will check back tomorrow.  Lesle Chris 05/27/2014, 2:12 PM Lesle Chris, OTR/L [phone] 05/27/2014

## 2014-05-27 NOTE — Progress Notes (Signed)
Physical Therapy Treatment Patient Details Name: Ryan Ibarra MRN: [id number] DOB: 11-Sep-1946 Today's Date: 06/18/14    History of Present Illness      PT Comments    Pt motivated and hoping to d.c home tomorrow.  Follow Up Recommendations  Home health PT     Equipment Recommendations  Crutches    Recommendations for Other Services OT consult     Precautions / Restrictions Precautions Precautions: Knee;Fall Required Braces or Orthoses: Knee Immobilizer - Right Knee Immobilizer - Right: Discontinue once straight leg raise with < 10 degree lag Restrictions Weight Bearing Restrictions: No Other Position/Activity Restrictions: WBAT    Mobility  Bed Mobility Overal bed mobility: Needs Assistance Bed Mobility: Supine to Sit;Sit to Supine     Supine to sit: Min guard Sit to supine: Min assist   General bed mobility comments: cues for sequence and use of L LE to self assist  Transfers Overall transfer level: Needs assistance Equipment used: Rolling walker (2 wheeled) Transfers: Sit to/from Stand Sit to Stand: Min guard         General transfer comment: cues for LE management and use of UEs to self assist  Ambulation/Gait Ambulation/Gait assistance: Min assist;Min guard Ambulation Distance (Feet): 150 Feet Assistive device: Rolling walker (2 wheeled) Gait Pattern/deviations: Step-to pattern;Shuffle;Trunk flexed     General Gait Details: cues for sequence, posture, stride length and position from [employer]            Wheelchair Mobility    Modified Rankin (Stroke Patients Only)       Balance                                    Cognition Arousal/Alertness: Awake/alert Behavior During Therapy: WFL for tasks assessed/performed Overall Cognitive Status: Within Functional Limits for tasks assessed                      Exercises      General Comments        Pertinent Vitals/Pain 6/10; premed, RN aware, ice packs  provided    Home Living                      Prior Function            PT Goals (current goals can now be found in the care plan section) Acute Rehab PT Goals Patient Stated Goal: Resume previous lifestyle with decreased pain PT Goal Formulation: With patient Time For Goal Achievement: 06/03/14 Potential to Achieve Goals: Good Progress towards PT goals: Progressing toward goals    Frequency  7X/week    PT Plan Current plan remains appropriate    Co-evaluation             End of Session Equipment Utilized During Treatment: Gait belt;Right knee immobilizer Activity Tolerance: Patient tolerated treatment well Patient left: in bed;with call bell/phone within reach     Time: 1423-9532 PT Time Calculation (min): 26 min  Charges:  $Gait Training: 23-37 mins                    G Codes:      Mathis Fare 06/18/14, 4:01 PM

## 2014-05-27 NOTE — Evaluation (Signed)
Physical Therapy Evaluation Patient Details Name: Ryan Ibarra MRN: [id number] DOB: July 31, 1946 Today's Date: 05/27/2014   History of Present Illness     Clinical Impression  Pt s/p R TKR presents with decreased R LE strength/ROM and post op pain limiting functional mobility.  Pt should progress to d/c home with family assist and HHPT follow up.    Follow Up Recommendations Home health PT    Equipment Recommendations  Crutches    Recommendations for Other Services OT consult     Precautions / Restrictions Precautions Precautions: Knee;Fall Required Braces or Orthoses: Knee Immobilizer - Right Knee Immobilizer - Right: Discontinue once straight leg raise with < 10 degree lag Restrictions Weight Bearing Restrictions: No Other Position/Activity Restrictions: WBAT      Mobility  Bed Mobility Overal bed mobility: Needs Assistance Bed Mobility: Supine to Sit;Sit to Supine     Supine to sit: Min assist Sit to supine: Min assist   General bed mobility comments: cues for sequence and use of L LE to self assist  Transfers Overall transfer level: Needs assistance Equipment used: Rolling walker (2 wheeled) Transfers: Sit to/from Stand Sit to Stand: Min assist         General transfer comment: cues for LE management and use of UEs to self assist  Ambulation/Gait Ambulation/Gait assistance: Min assist Ambulation Distance (Feet): 60 Feet Assistive device: Rolling walker (2 wheeled) Gait Pattern/deviations: Step-to pattern;Shuffle     General Gait Details: cues for sequence, posture, stride length and position from [employer]            Wheelchair Mobility    Modified Rankin (Stroke Patients Only)       Balance                                             Pertinent Vitals/Pain 8/10; premed, cold packs provided, RN aware    Home Living Family/patient expects to be discharged to:: Private residence Living Arrangements:  Spouse/significant other Available Help at Discharge: Family Type of Home: House Home Access: Stairs to enter Entrance Stairs-Rails: None Entrance Stairs-Number of Steps: 3 (shallow steps) Home Layout: Two level;Bed/bath upstairs Home Equipment: None      Prior Function Level of Independence: Independent               Hand Dominance        Extremity/Trunk Assessment   Upper Extremity Assessment: Overall WFL for tasks assessed           Lower Extremity Assessment: RLE deficits/detail RLE Deficits / Details: 2/5 quads with AAROM at knee -10-75    Cervical / Trunk Assessment: Normal  Communication   Communication: No difficulties  Cognition Arousal/Alertness: Awake/alert Behavior During Therapy: WFL for tasks assessed/performed Overall Cognitive Status: Within Functional Limits for tasks assessed                      General Comments      Exercises Total Joint Exercises Ankle Circles/Pumps: AROM;Both;10 reps;Supine Quad Sets: AROM;Both;10 reps;Supine Heel Slides: AAROM;Right;10 reps;Supine Straight Leg Raises: AAROM;Right;10 reps;Supine      Assessment/Plan    PT Assessment Patient needs continued PT services  PT Diagnosis Difficulty walking   PT Problem List Decreased strength;Decreased range of motion;Decreased activity tolerance;Decreased mobility;Decreased knowledge of use of DME;Pain  PT Treatment Interventions DME instruction;Gait training;Stair training;Therapeutic activities;Functional mobility training;Therapeutic exercise;Patient/family  education   PT Goals (Current goals can be found in the Care Plan section) Acute Rehab PT Goals Patient Stated Goal: Resume previous lifestyle with decreased pain PT Goal Formulation: With patient Time For Goal Achievement: 06/03/14 Potential to Achieve Goals: Good    Frequency 7X/week   Barriers to discharge        Co-evaluation               End of Session Equipment Utilized During  Treatment: Gait belt;Right knee immobilizer Activity Tolerance: Patient tolerated treatment well Patient left: in chair;with call bell/phone within reach Nurse Communication: Mobility status         Time: 6837-2902 PT Time Calculation (min): 37 min   Charges:   PT Evaluation $Initial PT Evaluation Tier I: 1 Procedure PT Treatments $Gait Training: 8-22 mins $Therapeutic Exercise: 8-22 mins   PT G Codes:          Mathis Fare 05/27/2014, 11:35 AM

## 2014-05-27 NOTE — Progress Notes (Signed)
sPOKE WITH PATIENT CONCERNING HHC -HE HAS ALREADY MADE ARRANGEMENT WITH A AGENCY IN [place]

## 2014-05-28 LAB — BASIC METABOLIC PANEL
BUN: 18 mg/dL (ref 6–23)
CO2: 30 meq/L (ref 19–32)
Calcium: 9.1 mg/dL (ref 8.4–10.5)
Chloride: 101 mEq/L (ref 96–112)
Creatinine, Ser: 0.94 mg/dL (ref 0.50–1.35)
GFR calc Af Amer: >90 mL/min (ref >90)
GFR calc non Af Amer: 85 mL/min — ABNORMAL LOW (ref >90)
GLUCOSE: 137 mg/dL — AB (ref 70–99)
POTASSIUM: 4.4 meq/L (ref 3.7–5.3)
SODIUM: 140 meq/L (ref 137–147)

## 2014-05-28 LAB — CBC
HCT: 30.9 % — ABNORMAL LOW (ref 39.0–52.0)
HEMOGLOBIN: 10.9 g/dL — AB (ref 13.0–17.0)
MCH: 32.5 pg (ref 26.0–34.0)
MCHC: 35.3 g/dL (ref 30.0–36.0)
MCV: 92.2 fL (ref 78.0–100.0)
Platelets: 226 10*3/uL (ref 150–400)
RBC: 3.35 MIL/uL — ABNORMAL LOW (ref 4.22–5.81)
RDW: 12.3 % (ref 11.5–15.5)
WBC: 14.6 10*3/uL — ABNORMAL HIGH (ref 4.0–10.5)

## 2014-05-28 NOTE — Progress Notes (Signed)
OT Cancellation Note  Patient Details Name: Ryan Ibarra MRN: [id number] DOB: 10-03-1946   Cancelled Treatment:    Reason Eval/Treat Not Completed: OT screened, no needs identified, will sign off.  Pt has had back surgery, has DME, AE and help as needed.  Verbalizes sequence for bathroom transfers.  , 05/28/2014, 1:09 PM Lesle Chris, OTR/L [phone] 05/28/2014

## 2014-05-28 NOTE — Plan of Care (Signed)
Problem: Phase III Progression Outcomes Goal: Anticoagulant follow-up in place Outcome: Not Applicable Date Met:  05/28/14 Xarelto VTE, no f/u needed.     

## 2014-05-28 NOTE — Progress Notes (Signed)
Physical Therapy Treatment Patient Details Name: Ryan Ibarra MRN: [id number] DOB: 05-27-1946 Today's Date: 2014/06/09    History of Present Illness      PT Comments    Progressing well and very motivated.  Should be ready for d/c later this pm.  Follow Up Recommendations  Home health PT     Equipment Recommendations       Recommendations for Other Services OT consult     Precautions / Restrictions Precautions Precautions: Knee;Fall Required Braces or Orthoses: Knee Immobilizer - Right Knee Immobilizer - Right: Discontinue once straight leg raise with < 10 degree lag Restrictions Weight Bearing Restrictions: No Other Position/Activity Restrictions: WBAT    Mobility  Bed Mobility Overal bed mobility: Needs Assistance Bed Mobility: Supine to Sit;Sit to Supine     Supine to sit: Supervision Sit to supine: Min guard   General bed mobility comments: cues for sequence and use of L LE to self assist  Transfers Overall transfer level: Needs assistance Equipment used: Rolling walker (2 wheeled) Transfers: Sit to/from Stand Sit to Stand: Min guard         General transfer comment: cues for LE management and use of UEs to self assist  Ambulation/Gait Ambulation/Gait assistance: Min guard Ambulation Distance (Feet): 200 Feet Assistive device: Rolling walker (2 wheeled) Gait Pattern/deviations: Step-to pattern;Shuffle     General Gait Details: cues for sequence, posture, stride length and position from RW   Stairs            Wheelchair Mobility    Modified Rankin (Stroke Patients Only)       Balance                                    Cognition Arousal/Alertness: Awake/alert Behavior During Therapy: WFL for tasks assessed/performed Overall Cognitive Status: Within Functional Limits for tasks assessed                      Exercises Total Joint Exercises Ankle Circles/Pumps: AROM;Both;10 reps;Supine Quad Sets:  AROM;Both;Supine;15 reps Heel Slides: AAROM;Right;Supine;20 reps Straight Leg Raises: AAROM;Right;Supine;20 reps Goniometric ROM: AAROM at R knee - 8 - 80    General Comments        Pertinent Vitals/Pain Jun 10, 2023; premed, ice packs provided    Home Living                      Prior Function            PT Goals (current goals can now be found in the care plan section) Acute Rehab PT Goals Patient Stated Goal: Resume previous lifestyle with decreased pain PT Goal Formulation: With patient Time For Goal Achievement: 06/03/14 Potential to Achieve Goals: Good Progress towards PT goals: Progressing toward goals    Frequency  7X/week    PT Plan Current plan remains appropriate    Co-evaluation             End of Session Equipment Utilized During Treatment: Gait belt;Right knee immobilizer Activity Tolerance: Patient tolerated treatment well Patient left: in bed;with call bell/phone within reach     Time: 5885-0277 PT Time Calculation (min): 30 min  Charges:  $Gait Training: 8-22 mins $Therapeutic Exercise: 8-22 mins                    G Codes:      , 06/09/14, 11:21 AM

## 2014-05-28 NOTE — Progress Notes (Signed)
Physical Therapy Treatment Patient Details Name: Ryan Ibarra MRN: [id number] DOB: 1946/09/12 Today's Date: June 06, 2014    History of Present Illness      PT Comments    Reviewed don/doff KI, stairs and home therex program.  Pt assisted with dressing to prep for d/c  Follow Up Recommendations  Home health PT     Equipment Recommendations  Crutches    Recommendations for Other Services       Precautions / Restrictions Precautions Precautions: Knee;Fall Required Braces or Orthoses: Knee Immobilizer - Right Knee Immobilizer - Right: Discontinue once straight leg raise with < 10 degree lag Restrictions Weight Bearing Restrictions: No Other Position/Activity Restrictions: WBAT    Mobility  Bed Mobility Overal bed mobility: Modified Independent Bed Mobility: Supine to Sit;Sit to Supine     Supine to sit: Modified independent (Device/Increase time) Sit to supine: Modified independent (Device/Increase time)   General bed mobility comments: pt self assisting R LE with L LE  Transfers Overall transfer level: Needs assistance Equipment used: Rolling walker (2 wheeled) Transfers: Sit to/from Stand Sit to Stand: Supervision         General transfer comment: cues for use of UEs to self assist  Ambulation/Gait Ambulation/Gait assistance: Supervision Ambulation Distance (Feet): 70 Feet Assistive device: Rolling walker (2 wheeled) Gait Pattern/deviations: Step-to pattern;Shuffle;Trunk flexed     General Gait Details: cues for posture, stride length and position from RW   Stairs Stairs: Yes Stairs assistance: Min assist Stair Management: No rails;One rail Right;Step to pattern;Forwards;With crutches Number of Stairs: 15 General stair comments: 10 stairs with rail and crutch, 5 steps with bil crutches; cues for sequence and foot/crutch placement  Wheelchair Mobility    Modified Rankin (Stroke Patients Only)       Balance                                     Cognition Arousal/Alertness: Awake/alert Behavior During Therapy: WFL for tasks assessed/performed Overall Cognitive Status: Within Functional Limits for tasks assessed                      Exercises      General Comments        Pertinent Vitals/Pain 4/10; premed    Home Living                      Prior Function            PT Goals (current goals can now be found in the care plan section) Acute Rehab PT Goals Patient Stated Goal: Resume previous lifestyle with decreased pain PT Goal Formulation: With patient Time For Goal Achievement: 06/03/14 Potential to Achieve Goals: Good Progress towards PT goals: Progressing toward goals    Frequency  7X/week    PT Plan Current plan remains appropriate    Co-evaluation             End of Session Equipment Utilized During Treatment: Gait belt;Right knee immobilizer Activity Tolerance: Patient tolerated treatment well Patient left: in bed;with call bell/phone within reach     Time: 1222-1259 PT Time Calculation (min): 37 min  Charges:  $Gait Training: 8-22 mins $Therapeutic Activity: 8-22 mins                    G Codes:      , 06/06/14, 1:12 PM

## 2014-05-28 NOTE — Progress Notes (Signed)
   Subjective: 2 Days Post-Op Procedure(s) (LRB): RIGHT TOTAL KNEE ARTHROPLASTY (Right) Patient reports pain as moderate.   Patient seen in rounds with Dr. Wynelle Link. Patient is well, and has had no acute complaints or problems. No complications overnight. No SOB or chest pain. He reports that he is feeling a little better than yesterday. Plan is to go Home after hospital stay.  Objective: Vital signs in last 24 hours: Temp:  [98.1 F (36.7 C)-98.3 F (36.8 C)] 98.3 F (36.8 C) (06/10 0612) Pulse Rate:  [78-100] 78 (06/10 0612) Resp:  [16-18] 16 (06/10 0612) BP: (116-135)/(69-84) 116/69 mmHg (06/10 0612) SpO2:  [94 %] 94 % (06/10 0612)  Intake/Output from previous day:  Intake/Output Summary (Last 24 hours) at 05/28/14 0804 Last data filed at 05/28/14 0210  Gross per 24 hour  Intake 1363.33 ml  Output   2275 ml  Net -911.67 ml    Intake/Output this shift:    Labs:  Recent Labs  05/27/14 0435 05/28/14 0420  HGB 11.3* 10.9*    Recent Labs  05/27/14 0435 05/28/14 0420  WBC 14.1* 14.6*  RBC 3.47* 3.35*  HCT 31.7* 30.9*  PLT 231 226    Recent Labs  05/27/14 0435 05/28/14 0420  NA 138 140  K 4.1 4.4  CL 102 101  CO2 26 30  BUN 14 18  CREATININE 0.93 0.94  GLUCOSE 150* 137*  CALCIUM 8.5 9.1   No results found for this basename: LABPT, INR,  in the last 72 hours  EXAM General - Patient is Alert and Oriented Extremity - Neurologically intact Intact pulses distally Dorsiflexion/Plantar flexion intact Compartment soft Dressing/Incision - clean, dry, no drainage Motor Function - intact, moving foot and toes well on exam.   Past Medical History  Diagnosis Date  . Hypertension   . Anxiety   . History of kidney stones   . GERD (gastroesophageal reflux disease)   . Neuromuscular disorder     neuropathy   . Neuropathy   . Chronic back pain   . Depression   . Hyperlipidemia   . Arthritis     Assessment/Plan: 2 Days Post-Op Procedure(s)  (LRB): RIGHT TOTAL KNEE ARTHROPLASTY (Right) Principal Problem:   OA (osteoarthritis) of knee  Estimated body mass index is 25.68 kg/(m^2) as calculated from the following:   Height as of this encounter: 5\' 10"  (1.778 m).   Weight as of this encounter: 81.194 kg (179 lb). Advance diet Up with therapy D/C IV fluids Discharge home with home health  DVT Prophylaxis - Xarelto Weight-Bearing as tolerated   Will do two sessions of therapy today. DC home if he progresses well.   Ardeen Jourdain, PA-C Orthopaedic Surgery 05/28/2014, 8:04 AM

## 2014-05-30 NOTE — Discharge Summary (Signed)
Physician Discharge Summary   Patient ID: Ryan Ibarra MRN: [id number] DOB/AGE: 1946-09-12 68 y.o.  Admit date: 05/26/2014 Discharge date: 05/28/2014  Primary Diagnosis: Osteoarthritis, right knee   Admission Diagnoses:  Past Medical History  Diagnosis Date  . Hypertension   . Anxiety   . History of kidney stones   . GERD (gastroesophageal reflux disease)   . Neuromuscular disorder     neuropathy   . Neuropathy   . Chronic back pain   . Depression   . Hyperlipidemia   . Arthritis    Discharge Diagnoses:   Principal Problem:   OA (osteoarthritis) of knee  Estimated body mass index is 25.68 kg/(m^2) as calculated from the following:   Height as of this encounter: _0  (1.778 m).   Weight as of this encounter: 81.194 kg (179 lb).  Procedure:  Procedure(s) (LRB): RIGHT TOTAL KNEE ARTHROPLASTY (Right)   Consults: None  HPI: Ryan Ibarra, 68 y.o. male, has a history of pain and functional disability in the right knee due to arthritis and has failed non-surgical conservative treatments for greater than 12 weeks to includeNSAID's and/or analgesics, corticosteriod injections, flexibility and strengthening excercises and activity modification. Onset of symptoms was gradual, starting 5 years ago with gradually worsening course since that time. The patient noted no past surgery on the right knee(s). Patient currently rates pain in the right knee(s) at 8 out of 10 with activity. Patient has night pain, worsening of pain with activity and weight bearing, pain that interferes with activities of daily living, pain with passive range of motion, crepitus and joint swelling. Patient has evidence of periarticular osteophytes and joint space narrowing by imaging studies. There is no active infection  Laboratory Data: Admission on 05/26/2014, Discharged on 05/28/2014  Component Date Value Ref Range Status  . ABO/RH(D) 05/26/2014 O POS   Final  . Antibody Screen 05/26/2014 NEG   Final  .  Sample Expiration 05/26/2014 05/29/2014   Final  . ABO/RH(D) 05/26/2014 O POS   Final  . WBC 05/27/2014 14.1* 4.0 - 10.5 K/uL Final  . RBC 05/27/2014 3.47* 4.22 - 5.81 MIL/uL Final  . Hemoglobin 05/27/2014 11.3* 13.0 - 17.0 g/dL Final  . HCT 05/27/2014 31.7* 39.0 - 52.0 % Final  . MCV 05/27/2014 91.4  78.0 - 100.0 fL Final  . MCH 05/27/2014 32.6  26.0 - 34.0 pg Final  . MCHC 05/27/2014 35.6  30.0 - 36.0 g/dL Final  . RDW 05/27/2014 12.3  11.5 - 15.5 % Final  . Platelets 05/27/2014 231  150 - 400 K/uL Final  . Sodium 05/27/2014 138  137 - 147 mEq/L Final  . Potassium 05/27/2014 4.1  3.7 - 5.3 mEq/L Final  . Chloride 05/27/2014 102  96 - 112 mEq/L Final  . CO2 05/27/2014 26  19 - 32 mEq/L Final  . Glucose, Bld 05/27/2014 150* 70 - 99 mg/dL Final  . BUN 05/27/2014 14  6 - 23 mg/dL Final  . Creatinine, Ser 05/27/2014 0.93  0.50 - 1.35 mg/dL Final  . Calcium 05/27/2014 8.5  8.4 - 10.5 mg/dL Final  . GFR calc non Af Amer 05/27/2014 86* >90 mL/min Final  . GFR calc Af Amer 05/27/2014 >90  >90 mL/min Final   Comment: (NOTE)                          The eGFR has been calculated using the CKD EPI equation.  This calculation has not been validated in all clinical situations.                          eGFR's persistently <90 mL/min signify possible Chronic Kidney                          Disease.  . WBC 05/28/2014 14.6* 4.0 - 10.5 K/uL Final  . RBC 05/28/2014 3.35* 4.22 - 5.81 MIL/uL Final  . Hemoglobin 05/28/2014 10.9* 13.0 - 17.0 g/dL Final  . HCT 05/28/2014 30.9* 39.0 - 52.0 % Final  . MCV 05/28/2014 92.2  78.0 - 100.0 fL Final  . MCH 05/28/2014 32.5  26.0 - 34.0 pg Final  . MCHC 05/28/2014 35.3  30.0 - 36.0 g/dL Final  . RDW 05/28/2014 12.3  11.5 - 15.5 % Final  . Platelets 05/28/2014 226  150 - 400 K/uL Final  . Sodium 05/28/2014 140  137 - 147 mEq/L Final  . Potassium 05/28/2014 4.4  3.7 - 5.3 mEq/L Final  . Chloride 05/28/2014 101  96 - 112 mEq/L Final  . CO2  05/28/2014 30  19 - 32 mEq/L Final  . Glucose, Bld 05/28/2014 137* 70 - 99 mg/dL Final  . BUN 05/28/2014 18  6 - 23 mg/dL Final  . Creatinine, Ser 05/28/2014 0.94  0.50 - 1.35 mg/dL Final  . Calcium 05/28/2014 9.1  8.4 - 10.5 mg/dL Final  . GFR calc non Af Amer 05/28/2014 85* >90 mL/min Final  . GFR calc Af Amer 05/28/2014 >90  >90 mL/min Final   Comment: (NOTE)                          The eGFR has been calculated using the CKD EPI equation.                          This calculation has not been validated in all clinical situations.                          eGFR's persistently <90 mL/min signify possible Chronic Kidney                          Disease.  Hospital Outpatient Visit on 05/16/2014  Component Date Value Ref Range Status  . aPTT 05/16/2014 30  24 - 37 seconds Final  . WBC 05/16/2014 6.3  4.0 - 10.5 K/uL Final  . RBC 05/16/2014 4.29  4.22 - 5.81 MIL/uL Final  . Hemoglobin 05/16/2014 13.9  13.0 - 17.0 g/dL Final  . HCT 05/16/2014 39.6  39.0 - 52.0 % Final  . MCV 05/16/2014 92.3  78.0 - 100.0 fL Final  . MCH 05/16/2014 32.4  26.0 - 34.0 pg Final  . MCHC 05/16/2014 35.1  30.0 - 36.0 g/dL Final  . RDW 05/16/2014 12.1  11.5 - 15.5 % Final  . Platelets 05/16/2014 259  150 - 400 K/uL Final  . Sodium 05/16/2014 141  137 - 147 mEq/L Final  . Potassium 05/16/2014 4.5  3.7 - 5.3 mEq/L Final  . Chloride 05/16/2014 101  96 - 112 mEq/L Final  . CO2 05/16/2014 30  19 - 32 mEq/L Final  . Glucose, Bld 05/16/2014 89  70 - 99 mg/dL Final  . BUN 05/16/2014 13  6 - 23 mg/dL  Final  . Creatinine, Ser 05/16/2014 1.17  0.50 - 1.35 mg/dL Final  . Calcium 05/16/2014 9.6  8.4 - 10.5 mg/dL Final  . Total Protein 05/16/2014 7.3  6.0 - 8.3 g/dL Final  . Albumin 05/16/2014 4.6  3.5 - 5.2 g/dL Final  . AST 05/16/2014 18  0 - 37 U/L Final  . ALT 05/16/2014 14  0 - 53 U/L Final  . Alkaline Phosphatase 05/16/2014 83  39 - 117 U/L Final  . Total Bilirubin 05/16/2014 0.3  0.3 - 1.2 mg/dL Final  . GFR calc  non Af Amer 05/16/2014 64* >90 mL/min Final  . GFR calc Af Amer 05/16/2014 74* >90 mL/min Final   Comment: (NOTE)                          The eGFR has been calculated using the CKD EPI equation.                          This calculation has not been validated in all clinical situations.                          eGFR's persistently <90 mL/min signify possible Chronic Kidney                          Disease.  Marland Kitchen Prothrombin Time 05/16/2014 12.5  11.6 - 15.2 seconds Final  . INR 05/16/2014 0.95  0.00 - 1.49 Final  . Color, Urine 05/16/2014 YELLOW  YELLOW Final  . APPearance 05/16/2014 CLEAR  CLEAR Final  . Specific Gravity, Urine 05/16/2014 1.008  1.005 - 1.030 Final  . pH 05/16/2014 8.0  5.0 - 8.0 Final  . Glucose, UA 05/16/2014 NEGATIVE  NEGATIVE mg/dL Final  . Hgb urine dipstick 05/16/2014 NEGATIVE  NEGATIVE Final  . Bilirubin Urine 05/16/2014 NEGATIVE  NEGATIVE Final  . Ketones, ur 05/16/2014 NEGATIVE  NEGATIVE mg/dL Final  . Protein, ur 05/16/2014 NEGATIVE  NEGATIVE mg/dL Final  . Urobilinogen, UA 05/16/2014 0.2  0.0 - 1.0 mg/dL Final  . Nitrite 05/16/2014 NEGATIVE  NEGATIVE Final  . Leukocytes, UA 05/16/2014 NEGATIVE  NEGATIVE Final   MICROSCOPIC NOT DONE ON URINES WITH NEGATIVE PROTEIN, BLOOD, LEUKOCYTES, NITRITE, OR GLUCOSE <1000 mg/dL.  Marland Kitchen MRSA, PCR 05/16/2014 NEGATIVE  NEGATIVE Final  . Staphylococcus aureus 05/16/2014 POSITIVE* NEGATIVE Final   Comment:                                 The Xpert SA Assay (FDA                          approved for NASAL specimens                          in patients over 46 years of age),                          is one component of                          a comprehensive surveillance  program.  Test performance has                          been validated by [facility] for patients greater                          than or equal to 31 year old.                          It is not intended                           to diagnose infection nor to                          guide or monitor treatment.     X-Rays:Dg Chest 2 View  05/16/2014   CLINICAL DATA:  Preop for right knee replacement.  Hypertension.  EXAM: CHEST  2 VIEW  COMPARISON:  06/20/2013  FINDINGS: Cardiac silhouette is normal in size and configuration. The aorta is mildly uncoiled. No mediastinal or hilar masses. No evidence of adenopathy.  Lungs are mildly hyperexpanded but clear. No pleural effusion. No pneumothorax.  Bony thorax is demineralized but intact.  IMPRESSION: No active cardiopulmonary disease.   Electronically Signed   By: Lajean Manes M.D.   On: 05/16/2014 14:33    EKG: Orders placed during the hospital encounter of 06/12/13  . EKG 12-LEAD  . EKG 12-LEAD     Hospital Course: Ryan Ibarra is a 68 y.o. who was admitted to [facility]. They were brought to the operating room on 05/26/2014 and underwent Procedure(s): RIGHT TOTAL KNEE ARTHROPLASTY.  Patient tolerated the procedure well and was later transferred to the recovery room and then to the orthopaedic floor for postoperative care.  They were given PO and IV analgesics for pain control following their surgery.  They were given 24 hours of postoperative antibiotics of  Anti-infectives   Start     Dose/Rate Route Frequency Ordered Stop   05/26/14 1600  ceFAZolin (ANCEF) IVPB 2 g/50 mL premix     2 g 100 mL/hr over 30 Minutes Intravenous Every 6 hours 05/26/14 1313 05/26/14 2229   05/26/14 0759  ceFAZolin (ANCEF) IVPB 2 g/50 mL premix     2 g 100 mL/hr over 30 Minutes Intravenous On call to O.R. 05/26/14 0759 05/26/14 1038     and started on DVT prophylaxis in the form of Xarelto.   PT and OT were ordered for total joint protocol.  Discharge planning consulted to help with postop disposition and equipment needs.  Patient had a fair night on the evening of surgery.  They started to get up OOB with therapy on day one. Hemovac drain was pulled without  difficulty.  Continued to work with therapy into day two.  Dressing was changed on day two and the incision was clean and dry. Patient had progressed with therapy and meeting their goals.  Incision was healing well.  Patient was seen in rounds and was ready to go home.   Diet:Cardiac diet Activity:WBAT Follow-up:in 2 weeks Disposition - Home Discharged Condition: stable   Discharge Instructions   Call MD /  Call 911    Complete by:  As directed   If you experience chest pain or shortness of breath, CALL 911 and be transported to the hospital emergency room.  If you develope a fever above 101 F, pus (white drainage) or increased drainage or redness at the wound, or calf pain, call your surgeon's office.     Change dressing    Complete by:  As directed   Change dressing daily with sterile 4 x 4 inch gauze dressing and apply TED hose.     Constipation Prevention    Complete by:  As directed   Drink plenty of fluids.  Prune juice may be helpful.  You may use a stool softener, such as Colace (over the counter) 100 mg twice a day.  Use MiraLax (over the counter) for constipation as needed.     Diet - low sodium heart healthy    Complete by:  As directed      Discharge instructions    Complete by:  As directed   Walk with your walker. Weight bearing as tolerated [place] will follow you at home for your therapy  Change your dressing daily. Shower only, no tub bath. Call if any temperatures greater than 101 or any wound complications: [phone]     Do not put a pillow under the knee. Place it under the heel.    Complete by:  As directed      Driving restrictions    Complete by:  As directed   No driving     Increase activity slowly as tolerated    Complete by:  As directed      TED hose    Complete by:  As directed   Use stockings (TED hose) for 3 weeks on both leg(s).  You may remove them at night for sleeping.            Medication List    STOP taking these medications        aspirin 81 MG tablet     meloxicam 15 MG tablet  Commonly known as:  MOBIC     multivitamin with minerals Tabs tablet     oxyCODONE-acetaminophen 10-325 MG per tablet  Commonly known as:  PERCOCET      TAKE these medications       DSS 100 MG Caps  Take 100 mg by mouth 2 (two) times daily.     DULoxetine 60 MG capsule  Commonly known as:  CYMBALTA  Take 60 mg by mouth 2 (two) times daily.     fluticasone 50 MCG/ACT nasal spray  Commonly known as:  FLONASE  Place 2 sprays into both nostrils daily.     gabapentin 300 MG capsule  Commonly known as:  NEURONTIN  Take 600 mg by mouth 3 (three) times daily.     lisinopril 20 MG tablet  Commonly known as:  PRINIVIL,ZESTRIL  Take 20 mg by mouth at bedtime.     methocarbamol 500 MG tablet  Commonly known as:  ROBAXIN  Take 1 tablet (500 mg total) by mouth every 6 (six) hours as needed for muscle spasms.     oxyCODONE 5 MG immediate release tablet  Commonly known as:  Oxy IR/ROXICODONE  Take 1-4 tablets (5-20 mg total) by mouth every 3 (three) hours as needed for breakthrough pain.     PARoxetine 20 MG tablet  Commonly known as:  PAXIL  Take 20 mg by mouth every morning.     ranitidine  150 MG tablet  Commonly known as:  ZANTAC  Take 150 mg by mouth 2 (two) times daily.     rivaroxaban 10 MG Tabs tablet  Commonly known as:  XARELTO  Take 1 tablet (10 mg total) by mouth daily with breakfast.     simvastatin 40 MG tablet  Commonly known as:  ZOCOR  Take 40 mg by mouth every evening.     traZODone 50 MG tablet  Commonly known as:  DESYREL  Take 150 mg by mouth at bedtime.           Follow-up Information   Follow up with Gearlean Alf, MD In 2 weeks.   Specialty:  Orthopedic Surgery   Contact information:   [address] [place] [address] [phone]       Signed: Ardeen Jourdain, PA-C Orthopaedic Surgery 05/30/2014, 9:15 AM

## 2015-01-13 IMAGING — DX DG LUMBAR SPINE 2-3V
1 series · 1 of 1 positions shown · non-contrast
Comparison: MRI 11/20/2012

CLINICAL DATA: PLIF

LUMBAR SPINE - 2-3 VIEW

[lat]
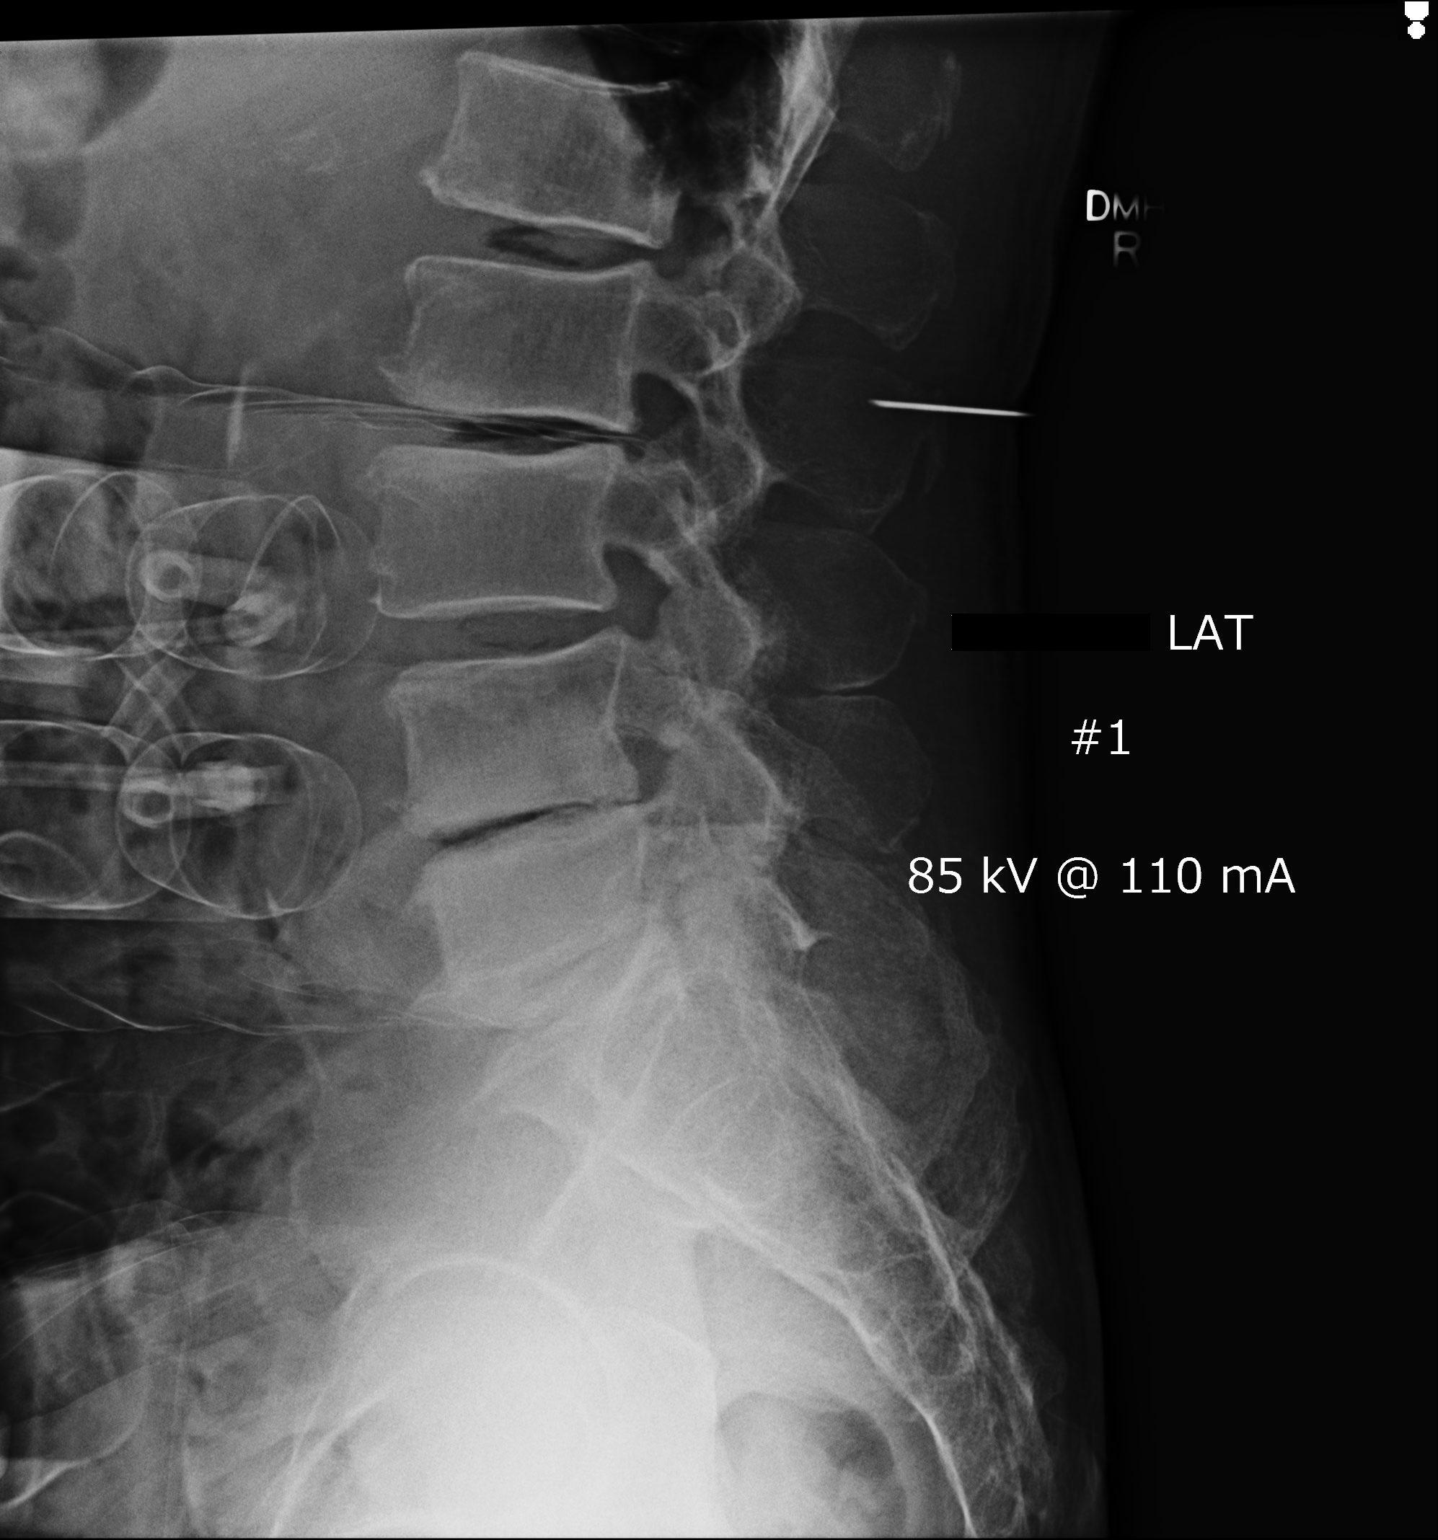

[1 of 1 positions shown; findings below may reference images not displayed]

FINDINGS: Initial film shows a needle overlying the spinous process
of L2.  Second film shows a probe directed between the spinous
processes of L2 and L3, towards the L2-3 disc space.
IMPRESSION: The L2-3 disc space localized.

## 2015-12-10 IMAGING — CR DG CHEST 2V
2 series · 2 of 2 positions shown · non-contrast
Comparison: 06/20/2013

CLINICAL DATA: Preop for right knee replacement.  Hypertension.

EXAM:
CHEST  2 VIEW

[w chest pa]
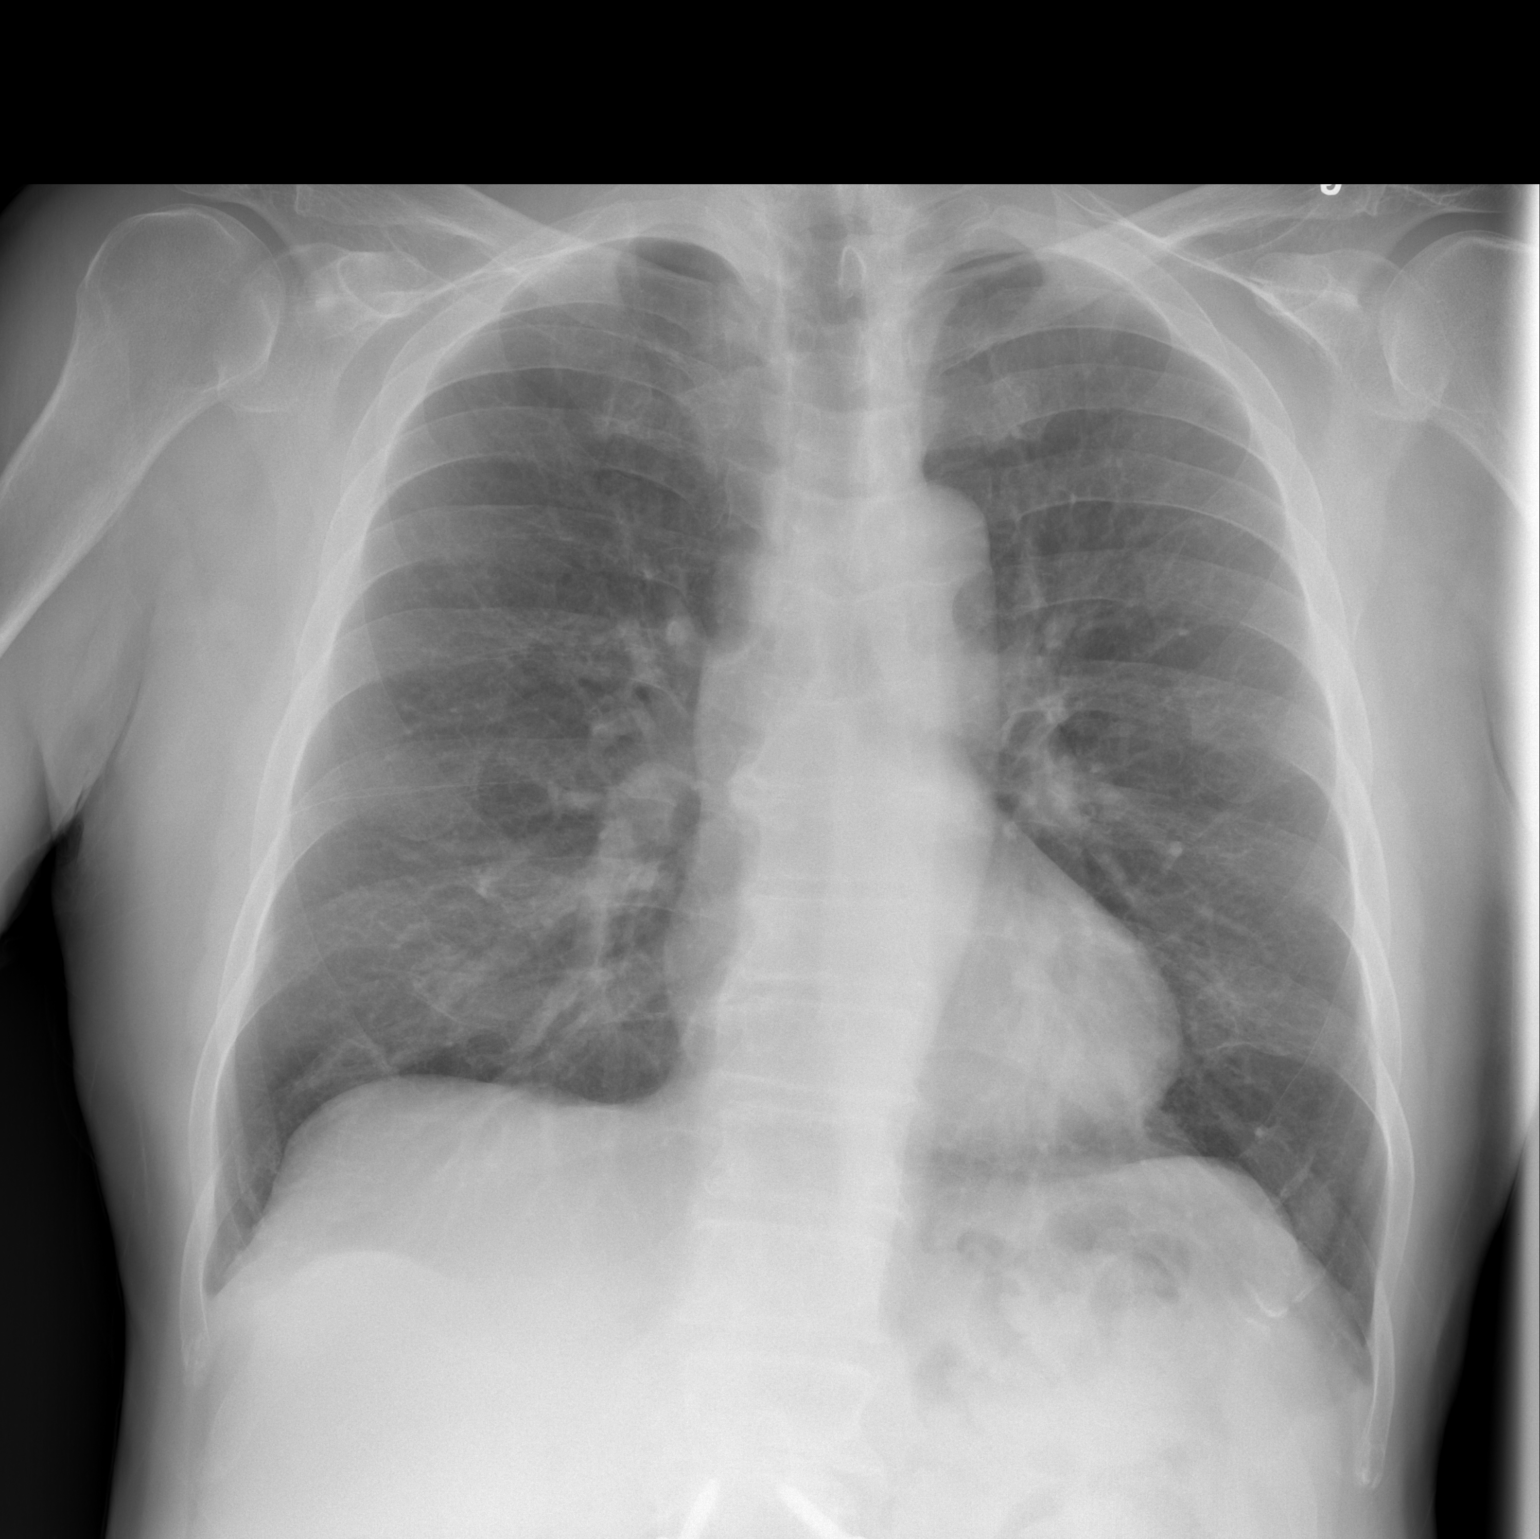

[w chest lat]
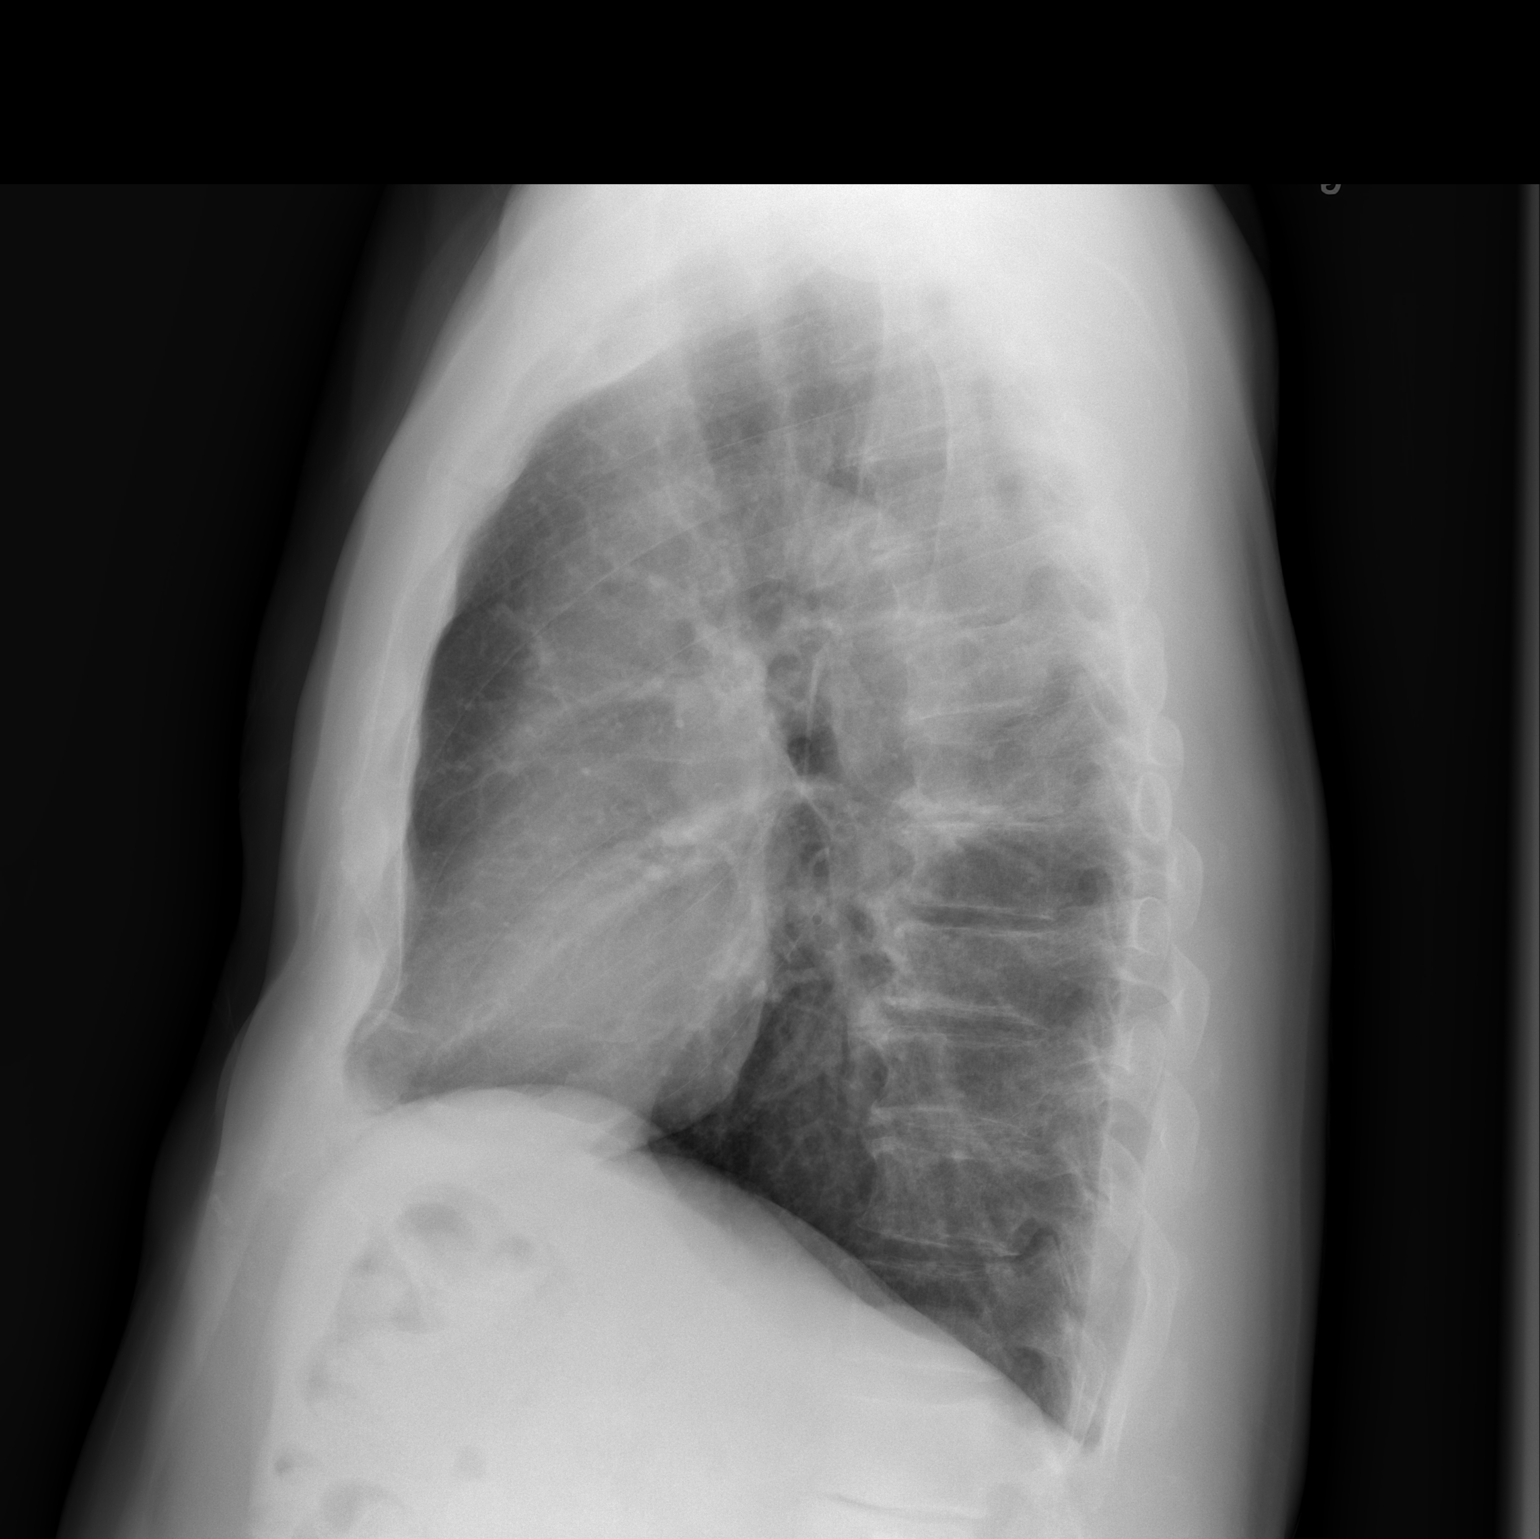

[2 of 2 positions shown; findings below may reference images not displayed]

FINDINGS: Cardiac silhouette is normal in size and configuration. The aorta is
mildly uncoiled. No mediastinal or hilar masses. No evidence of
adenopathy.

Lungs are mildly hyperexpanded but clear. No pleural effusion. No
pneumothorax.

Bony thorax is demineralized but intact.
IMPRESSION: No active cardiopulmonary disease.

## 2015-12-28 DIAGNOSIS — L219 Seborrheic dermatitis, unspecified: Secondary | ICD-10-CM | POA: Diagnosis not present

## 2015-12-28 DIAGNOSIS — M961 Postlaminectomy syndrome, not elsewhere classified: Secondary | ICD-10-CM | POA: Diagnosis not present

## 2015-12-28 DIAGNOSIS — E782 Mixed hyperlipidemia: Secondary | ICD-10-CM | POA: Diagnosis not present

## 2015-12-28 DIAGNOSIS — Z0000 Encounter for general adult medical examination without abnormal findings: Secondary | ICD-10-CM | POA: Diagnosis not present

## 2015-12-28 DIAGNOSIS — R110 Nausea: Secondary | ICD-10-CM | POA: Diagnosis not present

## 2015-12-28 DIAGNOSIS — M47812 Spondylosis without myelopathy or radiculopathy, cervical region: Secondary | ICD-10-CM | POA: Diagnosis not present

## 2015-12-28 DIAGNOSIS — F5105 Insomnia due to other mental disorder: Secondary | ICD-10-CM | POA: Diagnosis not present

## 2015-12-28 DIAGNOSIS — I10 Essential (primary) hypertension: Secondary | ICD-10-CM | POA: Diagnosis not present

## 2015-12-28 DIAGNOSIS — G609 Hereditary and idiopathic neuropathy, unspecified: Secondary | ICD-10-CM | POA: Diagnosis not present

## 2015-12-28 DIAGNOSIS — F329 Major depressive disorder, single episode, unspecified: Secondary | ICD-10-CM | POA: Diagnosis not present

## 2016-01-19 DIAGNOSIS — H2512 Age-related nuclear cataract, left eye: Secondary | ICD-10-CM | POA: Diagnosis not present

## 2016-01-19 DIAGNOSIS — H35033 Hypertensive retinopathy, bilateral: Secondary | ICD-10-CM | POA: Diagnosis not present

## 2016-01-19 DIAGNOSIS — H269 Unspecified cataract: Secondary | ICD-10-CM | POA: Diagnosis not present

## 2016-02-10 DIAGNOSIS — M5416 Radiculopathy, lumbar region: Secondary | ICD-10-CM | POA: Diagnosis not present

## 2016-02-10 DIAGNOSIS — M961 Postlaminectomy syndrome, not elsewhere classified: Secondary | ICD-10-CM | POA: Diagnosis not present

## 2016-02-10 DIAGNOSIS — G603 Idiopathic progressive neuropathy: Secondary | ICD-10-CM | POA: Diagnosis not present

## 2016-04-06 DIAGNOSIS — M5416 Radiculopathy, lumbar region: Secondary | ICD-10-CM | POA: Diagnosis not present

## 2016-04-06 DIAGNOSIS — G603 Idiopathic progressive neuropathy: Secondary | ICD-10-CM | POA: Diagnosis not present

## 2016-04-06 DIAGNOSIS — M961 Postlaminectomy syndrome, not elsewhere classified: Secondary | ICD-10-CM | POA: Diagnosis not present

## 2016-06-13 DIAGNOSIS — M961 Postlaminectomy syndrome, not elsewhere classified: Secondary | ICD-10-CM | POA: Diagnosis not present

## 2016-06-13 DIAGNOSIS — G603 Idiopathic progressive neuropathy: Secondary | ICD-10-CM | POA: Diagnosis not present

## 2016-06-13 DIAGNOSIS — M5416 Radiculopathy, lumbar region: Secondary | ICD-10-CM | POA: Diagnosis not present

## 2016-06-27 DIAGNOSIS — M47812 Spondylosis without myelopathy or radiculopathy, cervical region: Secondary | ICD-10-CM | POA: Diagnosis not present

## 2016-06-27 DIAGNOSIS — M961 Postlaminectomy syndrome, not elsewhere classified: Secondary | ICD-10-CM | POA: Diagnosis not present

## 2016-06-27 DIAGNOSIS — Z6827 Body mass index (BMI) 27.0-27.9, adult: Secondary | ICD-10-CM | POA: Diagnosis not present

## 2016-06-27 DIAGNOSIS — I10 Essential (primary) hypertension: Secondary | ICD-10-CM | POA: Diagnosis not present

## 2016-07-13 DIAGNOSIS — L570 Actinic keratosis: Secondary | ICD-10-CM | POA: Diagnosis not present

## 2016-07-13 DIAGNOSIS — L219 Seborrheic dermatitis, unspecified: Secondary | ICD-10-CM | POA: Diagnosis not present

## 2016-09-06 DIAGNOSIS — G603 Idiopathic progressive neuropathy: Secondary | ICD-10-CM | POA: Diagnosis not present

## 2016-09-06 DIAGNOSIS — M961 Postlaminectomy syndrome, not elsewhere classified: Secondary | ICD-10-CM | POA: Diagnosis not present

## 2016-09-06 DIAGNOSIS — M5416 Radiculopathy, lumbar region: Secondary | ICD-10-CM | POA: Diagnosis not present

## 2016-10-26 DIAGNOSIS — Z23 Encounter for immunization: Secondary | ICD-10-CM | POA: Diagnosis not present

## 2016-12-05 DIAGNOSIS — G603 Idiopathic progressive neuropathy: Secondary | ICD-10-CM | POA: Diagnosis not present

## 2016-12-05 DIAGNOSIS — M545 Low back pain: Secondary | ICD-10-CM | POA: Diagnosis not present

## 2016-12-05 DIAGNOSIS — M5416 Radiculopathy, lumbar region: Secondary | ICD-10-CM | POA: Diagnosis not present

## 2016-12-05 DIAGNOSIS — Z79899 Other long term (current) drug therapy: Secondary | ICD-10-CM | POA: Diagnosis not present

## 2016-12-05 DIAGNOSIS — M961 Postlaminectomy syndrome, not elsewhere classified: Secondary | ICD-10-CM | POA: Diagnosis not present

## 2016-12-09 DIAGNOSIS — L03032 Cellulitis of left toe: Secondary | ICD-10-CM | POA: Diagnosis not present

## 2016-12-14 DIAGNOSIS — Z961 Presence of intraocular lens: Secondary | ICD-10-CM | POA: Diagnosis not present

## 2016-12-28 DIAGNOSIS — M5416 Radiculopathy, lumbar region: Secondary | ICD-10-CM | POA: Diagnosis not present

## 2016-12-28 DIAGNOSIS — Z6830 Body mass index (BMI) 30.0-30.9, adult: Secondary | ICD-10-CM | POA: Diagnosis not present

## 2016-12-28 DIAGNOSIS — E782 Mixed hyperlipidemia: Secondary | ICD-10-CM | POA: Diagnosis not present

## 2016-12-28 DIAGNOSIS — I10 Essential (primary) hypertension: Secondary | ICD-10-CM | POA: Diagnosis not present

## 2016-12-28 DIAGNOSIS — Z0000 Encounter for general adult medical examination without abnormal findings: Secondary | ICD-10-CM | POA: Diagnosis not present

## 2016-12-28 DIAGNOSIS — G609 Hereditary and idiopathic neuropathy, unspecified: Secondary | ICD-10-CM | POA: Diagnosis not present

## 2016-12-28 DIAGNOSIS — M47812 Spondylosis without myelopathy or radiculopathy, cervical region: Secondary | ICD-10-CM | POA: Diagnosis not present

## 2016-12-28 DIAGNOSIS — M961 Postlaminectomy syndrome, not elsewhere classified: Secondary | ICD-10-CM | POA: Diagnosis not present

## 2016-12-28 DIAGNOSIS — F5105 Insomnia due to other mental disorder: Secondary | ICD-10-CM | POA: Diagnosis not present

## 2016-12-28 DIAGNOSIS — F329 Major depressive disorder, single episode, unspecified: Secondary | ICD-10-CM | POA: Diagnosis not present

## 2017-01-02 DIAGNOSIS — M48061 Spinal stenosis, lumbar region without neurogenic claudication: Secondary | ICD-10-CM | POA: Diagnosis not present

## 2017-01-02 DIAGNOSIS — M545 Low back pain: Secondary | ICD-10-CM | POA: Diagnosis not present

## 2017-01-02 DIAGNOSIS — Z981 Arthrodesis status: Secondary | ICD-10-CM | POA: Diagnosis not present

## 2017-01-02 DIAGNOSIS — M5416 Radiculopathy, lumbar region: Secondary | ICD-10-CM | POA: Diagnosis not present

## 2017-01-02 DIAGNOSIS — M5136 Other intervertebral disc degeneration, lumbar region: Secondary | ICD-10-CM | POA: Diagnosis not present

## 2017-01-02 DIAGNOSIS — M5137 Other intervertebral disc degeneration, lumbosacral region: Secondary | ICD-10-CM | POA: Diagnosis not present

## 2017-01-16 DIAGNOSIS — L820 Inflamed seborrheic keratosis: Secondary | ICD-10-CM | POA: Diagnosis not present

## 2017-01-16 DIAGNOSIS — D485 Neoplasm of uncertain behavior of skin: Secondary | ICD-10-CM | POA: Diagnosis not present

## 2017-01-30 DIAGNOSIS — M5416 Radiculopathy, lumbar region: Secondary | ICD-10-CM | POA: Diagnosis not present

## 2017-01-30 DIAGNOSIS — M48061 Spinal stenosis, lumbar region without neurogenic claudication: Secondary | ICD-10-CM | POA: Diagnosis not present

## 2017-01-30 DIAGNOSIS — Z6830 Body mass index (BMI) 30.0-30.9, adult: Secondary | ICD-10-CM | POA: Diagnosis not present

## 2017-01-30 DIAGNOSIS — I10 Essential (primary) hypertension: Secondary | ICD-10-CM | POA: Diagnosis not present

## 2017-02-20 DIAGNOSIS — M5416 Radiculopathy, lumbar region: Secondary | ICD-10-CM | POA: Diagnosis not present

## 2017-02-20 DIAGNOSIS — G629 Polyneuropathy, unspecified: Secondary | ICD-10-CM | POA: Diagnosis not present

## 2017-02-20 DIAGNOSIS — M48061 Spinal stenosis, lumbar region without neurogenic claudication: Secondary | ICD-10-CM | POA: Diagnosis not present

## 2017-02-27 DIAGNOSIS — M961 Postlaminectomy syndrome, not elsewhere classified: Secondary | ICD-10-CM | POA: Diagnosis not present

## 2017-02-27 DIAGNOSIS — M5416 Radiculopathy, lumbar region: Secondary | ICD-10-CM | POA: Diagnosis not present

## 2017-02-27 DIAGNOSIS — G603 Idiopathic progressive neuropathy: Secondary | ICD-10-CM | POA: Diagnosis not present

## 2017-03-09 DIAGNOSIS — M48061 Spinal stenosis, lumbar region without neurogenic claudication: Secondary | ICD-10-CM | POA: Diagnosis not present

## 2017-05-08 DIAGNOSIS — M961 Postlaminectomy syndrome, not elsewhere classified: Secondary | ICD-10-CM | POA: Diagnosis not present

## 2017-05-08 DIAGNOSIS — M5416 Radiculopathy, lumbar region: Secondary | ICD-10-CM | POA: Diagnosis not present

## 2017-05-08 DIAGNOSIS — G603 Idiopathic progressive neuropathy: Secondary | ICD-10-CM | POA: Diagnosis not present

## 2017-06-30 DIAGNOSIS — Z23 Encounter for immunization: Secondary | ICD-10-CM | POA: Diagnosis not present

## 2017-07-12 DIAGNOSIS — L570 Actinic keratosis: Secondary | ICD-10-CM | POA: Diagnosis not present

## 2017-07-12 DIAGNOSIS — L814 Other melanin hyperpigmentation: Secondary | ICD-10-CM | POA: Diagnosis not present

## 2017-07-12 DIAGNOSIS — L219 Seborrheic dermatitis, unspecified: Secondary | ICD-10-CM | POA: Diagnosis not present

## 2017-07-12 DIAGNOSIS — L821 Other seborrheic keratosis: Secondary | ICD-10-CM | POA: Diagnosis not present

## 2017-07-12 DIAGNOSIS — D485 Neoplasm of uncertain behavior of skin: Secondary | ICD-10-CM | POA: Diagnosis not present

## 2017-07-19 DIAGNOSIS — M5416 Radiculopathy, lumbar region: Secondary | ICD-10-CM | POA: Diagnosis not present

## 2017-07-19 DIAGNOSIS — M961 Postlaminectomy syndrome, not elsewhere classified: Secondary | ICD-10-CM | POA: Diagnosis not present

## 2017-07-19 DIAGNOSIS — G603 Idiopathic progressive neuropathy: Secondary | ICD-10-CM | POA: Diagnosis not present

## 2017-09-05 DIAGNOSIS — Z23 Encounter for immunization: Secondary | ICD-10-CM | POA: Diagnosis not present

## 2017-10-04 DIAGNOSIS — Z23 Encounter for immunization: Secondary | ICD-10-CM | POA: Diagnosis not present

## 2017-10-09 DIAGNOSIS — G603 Idiopathic progressive neuropathy: Secondary | ICD-10-CM | POA: Diagnosis not present

## 2017-10-09 DIAGNOSIS — M961 Postlaminectomy syndrome, not elsewhere classified: Secondary | ICD-10-CM | POA: Diagnosis not present

## 2017-10-09 DIAGNOSIS — Z79899 Other long term (current) drug therapy: Secondary | ICD-10-CM | POA: Diagnosis not present

## 2017-10-09 DIAGNOSIS — M5416 Radiculopathy, lumbar region: Secondary | ICD-10-CM | POA: Diagnosis not present

## 2017-10-11 DIAGNOSIS — L309 Dermatitis, unspecified: Secondary | ICD-10-CM | POA: Diagnosis not present

## 2017-10-11 DIAGNOSIS — L0100 Impetigo, unspecified: Secondary | ICD-10-CM | POA: Diagnosis not present

## 2017-10-18 DIAGNOSIS — I10 Essential (primary) hypertension: Secondary | ICD-10-CM | POA: Diagnosis not present

## 2017-10-18 DIAGNOSIS — E782 Mixed hyperlipidemia: Secondary | ICD-10-CM | POA: Diagnosis not present

## 2017-10-18 DIAGNOSIS — K210 Gastro-esophageal reflux disease with esophagitis: Secondary | ICD-10-CM | POA: Diagnosis not present

## 2017-10-18 DIAGNOSIS — G629 Polyneuropathy, unspecified: Secondary | ICD-10-CM | POA: Diagnosis not present

## 2017-10-25 DIAGNOSIS — D0462 Carcinoma in situ of skin of left upper limb, including shoulder: Secondary | ICD-10-CM | POA: Diagnosis not present

## 2017-10-25 DIAGNOSIS — D485 Neoplasm of uncertain behavior of skin: Secondary | ICD-10-CM | POA: Diagnosis not present

## 2017-10-25 DIAGNOSIS — L280 Lichen simplex chronicus: Secondary | ICD-10-CM | POA: Diagnosis not present

## 2017-11-01 DIAGNOSIS — S20412S Abrasion of left back wall of thorax, sequela: Secondary | ICD-10-CM | POA: Diagnosis not present

## 2017-11-01 DIAGNOSIS — Z125 Encounter for screening for malignant neoplasm of prostate: Secondary | ICD-10-CM | POA: Diagnosis not present

## 2017-11-01 DIAGNOSIS — K210 Gastro-esophageal reflux disease with esophagitis: Secondary | ICD-10-CM | POA: Diagnosis not present

## 2017-11-01 DIAGNOSIS — I10 Essential (primary) hypertension: Secondary | ICD-10-CM | POA: Diagnosis not present

## 2017-11-01 DIAGNOSIS — G629 Polyneuropathy, unspecified: Secondary | ICD-10-CM | POA: Diagnosis not present

## 2017-11-01 DIAGNOSIS — E782 Mixed hyperlipidemia: Secondary | ICD-10-CM | POA: Diagnosis not present

## 2017-11-23 DIAGNOSIS — C44629 Squamous cell carcinoma of skin of left upper limb, including shoulder: Secondary | ICD-10-CM | POA: Diagnosis not present

## 2017-11-23 DIAGNOSIS — D0462 Carcinoma in situ of skin of left upper limb, including shoulder: Secondary | ICD-10-CM | POA: Diagnosis not present

## 2017-12-04 DIAGNOSIS — M5416 Radiculopathy, lumbar region: Secondary | ICD-10-CM | POA: Diagnosis not present

## 2017-12-04 DIAGNOSIS — M961 Postlaminectomy syndrome, not elsewhere classified: Secondary | ICD-10-CM | POA: Diagnosis not present

## 2017-12-04 DIAGNOSIS — G603 Idiopathic progressive neuropathy: Secondary | ICD-10-CM | POA: Diagnosis not present

## 2017-12-07 DIAGNOSIS — Z4802 Encounter for removal of sutures: Secondary | ICD-10-CM | POA: Diagnosis not present

## 2018-01-22 DIAGNOSIS — Z131 Encounter for screening for diabetes mellitus: Secondary | ICD-10-CM | POA: Diagnosis not present

## 2018-01-22 DIAGNOSIS — Z0000 Encounter for general adult medical examination without abnormal findings: Secondary | ICD-10-CM | POA: Diagnosis not present

## 2018-01-22 DIAGNOSIS — K210 Gastro-esophageal reflux disease with esophagitis: Secondary | ICD-10-CM | POA: Diagnosis not present

## 2018-01-22 DIAGNOSIS — Z79899 Other long term (current) drug therapy: Secondary | ICD-10-CM | POA: Diagnosis not present

## 2018-01-22 DIAGNOSIS — S20412S Abrasion of left back wall of thorax, sequela: Secondary | ICD-10-CM | POA: Diagnosis not present

## 2018-01-22 DIAGNOSIS — G629 Polyneuropathy, unspecified: Secondary | ICD-10-CM | POA: Diagnosis not present

## 2018-01-22 DIAGNOSIS — I10 Essential (primary) hypertension: Secondary | ICD-10-CM | POA: Diagnosis not present

## 2018-01-22 DIAGNOSIS — E782 Mixed hyperlipidemia: Secondary | ICD-10-CM | POA: Diagnosis not present

## 2018-01-25 DIAGNOSIS — G603 Idiopathic progressive neuropathy: Secondary | ICD-10-CM | POA: Diagnosis not present

## 2018-01-25 DIAGNOSIS — M961 Postlaminectomy syndrome, not elsewhere classified: Secondary | ICD-10-CM | POA: Diagnosis not present

## 2018-01-25 DIAGNOSIS — M5416 Radiculopathy, lumbar region: Secondary | ICD-10-CM | POA: Diagnosis not present

## 2018-02-19 DIAGNOSIS — Z136 Encounter for screening for cardiovascular disorders: Secondary | ICD-10-CM | POA: Diagnosis not present

## 2018-02-19 DIAGNOSIS — I77811 Abdominal aortic ectasia: Secondary | ICD-10-CM | POA: Diagnosis not present

## 2018-02-19 DIAGNOSIS — Z87891 Personal history of nicotine dependence: Secondary | ICD-10-CM | POA: Diagnosis not present

## 2018-03-15 DIAGNOSIS — G603 Idiopathic progressive neuropathy: Secondary | ICD-10-CM | POA: Diagnosis not present

## 2018-03-15 DIAGNOSIS — M5416 Radiculopathy, lumbar region: Secondary | ICD-10-CM | POA: Diagnosis not present

## 2018-03-15 DIAGNOSIS — M961 Postlaminectomy syndrome, not elsewhere classified: Secondary | ICD-10-CM | POA: Diagnosis not present

## 2018-04-24 DIAGNOSIS — I10 Essential (primary) hypertension: Secondary | ICD-10-CM | POA: Diagnosis not present

## 2018-04-24 DIAGNOSIS — K210 Gastro-esophageal reflux disease with esophagitis: Secondary | ICD-10-CM | POA: Diagnosis not present

## 2018-04-24 DIAGNOSIS — E782 Mixed hyperlipidemia: Secondary | ICD-10-CM | POA: Diagnosis not present

## 2018-04-24 DIAGNOSIS — G629 Polyneuropathy, unspecified: Secondary | ICD-10-CM | POA: Diagnosis not present

## 2018-05-07 DIAGNOSIS — G603 Idiopathic progressive neuropathy: Secondary | ICD-10-CM | POA: Diagnosis not present

## 2018-05-07 DIAGNOSIS — M961 Postlaminectomy syndrome, not elsewhere classified: Secondary | ICD-10-CM | POA: Diagnosis not present

## 2018-05-07 DIAGNOSIS — M5416 Radiculopathy, lumbar region: Secondary | ICD-10-CM | POA: Diagnosis not present

## 2018-06-27 DIAGNOSIS — M961 Postlaminectomy syndrome, not elsewhere classified: Secondary | ICD-10-CM | POA: Diagnosis not present

## 2018-06-27 DIAGNOSIS — G603 Idiopathic progressive neuropathy: Secondary | ICD-10-CM | POA: Diagnosis not present

## 2018-06-27 DIAGNOSIS — M5416 Radiculopathy, lumbar region: Secondary | ICD-10-CM | POA: Diagnosis not present

## 2018-07-11 DIAGNOSIS — L570 Actinic keratosis: Secondary | ICD-10-CM | POA: Diagnosis not present

## 2018-07-11 DIAGNOSIS — L97509 Non-pressure chronic ulcer of other part of unspecified foot with unspecified severity: Secondary | ICD-10-CM | POA: Diagnosis not present

## 2018-07-11 DIAGNOSIS — L821 Other seborrheic keratosis: Secondary | ICD-10-CM | POA: Diagnosis not present

## 2018-07-11 DIAGNOSIS — L0100 Impetigo, unspecified: Secondary | ICD-10-CM | POA: Diagnosis not present

## 2018-07-25 DIAGNOSIS — L0100 Impetigo, unspecified: Secondary | ICD-10-CM | POA: Diagnosis not present

## 2018-07-25 DIAGNOSIS — L570 Actinic keratosis: Secondary | ICD-10-CM | POA: Diagnosis not present

## 2018-07-25 DIAGNOSIS — L97509 Non-pressure chronic ulcer of other part of unspecified foot with unspecified severity: Secondary | ICD-10-CM | POA: Diagnosis not present

## 2018-07-31 DIAGNOSIS — K210 Gastro-esophageal reflux disease with esophagitis: Secondary | ICD-10-CM | POA: Diagnosis not present

## 2018-07-31 DIAGNOSIS — E782 Mixed hyperlipidemia: Secondary | ICD-10-CM | POA: Diagnosis not present

## 2018-07-31 DIAGNOSIS — I10 Essential (primary) hypertension: Secondary | ICD-10-CM | POA: Diagnosis not present

## 2018-07-31 DIAGNOSIS — G629 Polyneuropathy, unspecified: Secondary | ICD-10-CM | POA: Diagnosis not present

## 2018-08-16 DIAGNOSIS — L0100 Impetigo, unspecified: Secondary | ICD-10-CM | POA: Diagnosis not present

## 2018-08-16 DIAGNOSIS — L219 Seborrheic dermatitis, unspecified: Secondary | ICD-10-CM | POA: Diagnosis not present

## 2018-08-30 DIAGNOSIS — M5416 Radiculopathy, lumbar region: Secondary | ICD-10-CM | POA: Diagnosis not present

## 2018-08-30 DIAGNOSIS — G603 Idiopathic progressive neuropathy: Secondary | ICD-10-CM | POA: Diagnosis not present

## 2018-08-30 DIAGNOSIS — M961 Postlaminectomy syndrome, not elsewhere classified: Secondary | ICD-10-CM | POA: Diagnosis not present

## 2018-08-30 DIAGNOSIS — M47817 Spondylosis without myelopathy or radiculopathy, lumbosacral region: Secondary | ICD-10-CM | POA: Diagnosis not present

## 2018-08-30 DIAGNOSIS — L0100 Impetigo, unspecified: Secondary | ICD-10-CM | POA: Diagnosis not present

## 2018-08-30 DIAGNOSIS — L219 Seborrheic dermatitis, unspecified: Secondary | ICD-10-CM | POA: Diagnosis not present

## 2018-08-30 DIAGNOSIS — Z79899 Other long term (current) drug therapy: Secondary | ICD-10-CM | POA: Diagnosis not present

## 2018-10-04 DIAGNOSIS — L219 Seborrheic dermatitis, unspecified: Secondary | ICD-10-CM | POA: Diagnosis not present

## 2018-10-04 DIAGNOSIS — L0100 Impetigo, unspecified: Secondary | ICD-10-CM | POA: Diagnosis not present

## 2018-10-12 DIAGNOSIS — E1140 Type 2 diabetes mellitus with diabetic neuropathy, unspecified: Secondary | ICD-10-CM | POA: Diagnosis not present

## 2018-10-12 DIAGNOSIS — L84 Corns and callosities: Secondary | ICD-10-CM | POA: Diagnosis not present

## 2018-10-12 DIAGNOSIS — L97529 Non-pressure chronic ulcer of other part of left foot with unspecified severity: Secondary | ICD-10-CM | POA: Diagnosis not present

## 2018-10-24 DIAGNOSIS — M961 Postlaminectomy syndrome, not elsewhere classified: Secondary | ICD-10-CM | POA: Diagnosis not present

## 2018-10-24 DIAGNOSIS — M5416 Radiculopathy, lumbar region: Secondary | ICD-10-CM | POA: Diagnosis not present

## 2018-10-24 DIAGNOSIS — G603 Idiopathic progressive neuropathy: Secondary | ICD-10-CM | POA: Diagnosis not present

## 2018-10-31 DIAGNOSIS — E782 Mixed hyperlipidemia: Secondary | ICD-10-CM | POA: Diagnosis not present

## 2018-10-31 DIAGNOSIS — L219 Seborrheic dermatitis, unspecified: Secondary | ICD-10-CM | POA: Diagnosis not present

## 2018-10-31 DIAGNOSIS — G629 Polyneuropathy, unspecified: Secondary | ICD-10-CM | POA: Diagnosis not present

## 2018-10-31 DIAGNOSIS — I10 Essential (primary) hypertension: Secondary | ICD-10-CM | POA: Diagnosis not present

## 2018-10-31 DIAGNOSIS — K210 Gastro-esophageal reflux disease with esophagitis: Secondary | ICD-10-CM | POA: Diagnosis not present

## 2018-11-28 DIAGNOSIS — L219 Seborrheic dermatitis, unspecified: Secondary | ICD-10-CM | POA: Diagnosis not present

## 2018-11-28 DIAGNOSIS — L570 Actinic keratosis: Secondary | ICD-10-CM | POA: Diagnosis not present

## 2018-12-10 DIAGNOSIS — M961 Postlaminectomy syndrome, not elsewhere classified: Secondary | ICD-10-CM | POA: Diagnosis not present

## 2018-12-10 DIAGNOSIS — G603 Idiopathic progressive neuropathy: Secondary | ICD-10-CM | POA: Diagnosis not present

## 2018-12-10 DIAGNOSIS — M5416 Radiculopathy, lumbar region: Secondary | ICD-10-CM | POA: Diagnosis not present

## 2018-12-24 DIAGNOSIS — L570 Actinic keratosis: Secondary | ICD-10-CM | POA: Diagnosis not present

## 2019-01-30 DIAGNOSIS — E782 Mixed hyperlipidemia: Secondary | ICD-10-CM | POA: Diagnosis not present

## 2019-01-30 DIAGNOSIS — Z0000 Encounter for general adult medical examination without abnormal findings: Secondary | ICD-10-CM | POA: Diagnosis not present

## 2019-01-30 DIAGNOSIS — Z1389 Encounter for screening for other disorder: Secondary | ICD-10-CM | POA: Diagnosis not present

## 2019-01-30 DIAGNOSIS — I10 Essential (primary) hypertension: Secondary | ICD-10-CM | POA: Diagnosis not present

## 2019-01-30 DIAGNOSIS — G629 Polyneuropathy, unspecified: Secondary | ICD-10-CM | POA: Diagnosis not present

## 2019-01-30 DIAGNOSIS — K210 Gastro-esophageal reflux disease with esophagitis: Secondary | ICD-10-CM | POA: Diagnosis not present

## 2019-02-12 DIAGNOSIS — G603 Idiopathic progressive neuropathy: Secondary | ICD-10-CM | POA: Diagnosis not present

## 2019-02-12 DIAGNOSIS — M961 Postlaminectomy syndrome, not elsewhere classified: Secondary | ICD-10-CM | POA: Diagnosis not present

## 2019-02-12 DIAGNOSIS — M5416 Radiculopathy, lumbar region: Secondary | ICD-10-CM | POA: Diagnosis not present

## 2019-03-26 DIAGNOSIS — L929 Granulomatous disorder of the skin and subcutaneous tissue, unspecified: Secondary | ICD-10-CM | POA: Diagnosis not present

## 2019-03-26 DIAGNOSIS — L570 Actinic keratosis: Secondary | ICD-10-CM | POA: Diagnosis not present

## 2019-03-26 DIAGNOSIS — L219 Seborrheic dermatitis, unspecified: Secondary | ICD-10-CM | POA: Diagnosis not present

## 2019-03-26 DIAGNOSIS — L280 Lichen simplex chronicus: Secondary | ICD-10-CM | POA: Diagnosis not present

## 2019-03-26 DIAGNOSIS — D485 Neoplasm of uncertain behavior of skin: Secondary | ICD-10-CM | POA: Diagnosis not present

## 2019-04-09 DIAGNOSIS — M5416 Radiculopathy, lumbar region: Secondary | ICD-10-CM | POA: Diagnosis not present

## 2019-04-09 DIAGNOSIS — G603 Idiopathic progressive neuropathy: Secondary | ICD-10-CM | POA: Diagnosis not present

## 2019-04-09 DIAGNOSIS — M961 Postlaminectomy syndrome, not elsewhere classified: Secondary | ICD-10-CM | POA: Diagnosis not present

## 2019-05-29 DIAGNOSIS — M961 Postlaminectomy syndrome, not elsewhere classified: Secondary | ICD-10-CM | POA: Diagnosis not present

## 2019-05-29 DIAGNOSIS — G603 Idiopathic progressive neuropathy: Secondary | ICD-10-CM | POA: Diagnosis not present

## 2019-05-29 DIAGNOSIS — M5416 Radiculopathy, lumbar region: Secondary | ICD-10-CM | POA: Diagnosis not present

## 2019-05-30 DIAGNOSIS — G629 Polyneuropathy, unspecified: Secondary | ICD-10-CM | POA: Diagnosis not present

## 2019-05-30 DIAGNOSIS — I10 Essential (primary) hypertension: Secondary | ICD-10-CM | POA: Diagnosis not present

## 2019-05-30 DIAGNOSIS — E782 Mixed hyperlipidemia: Secondary | ICD-10-CM | POA: Diagnosis not present

## 2019-05-30 DIAGNOSIS — K210 Gastro-esophageal reflux disease with esophagitis: Secondary | ICD-10-CM | POA: Diagnosis not present

## 2019-07-18 DIAGNOSIS — M5416 Radiculopathy, lumbar region: Secondary | ICD-10-CM | POA: Diagnosis not present

## 2019-07-18 DIAGNOSIS — G603 Idiopathic progressive neuropathy: Secondary | ICD-10-CM | POA: Diagnosis not present

## 2019-07-18 DIAGNOSIS — M545 Low back pain: Secondary | ICD-10-CM | POA: Diagnosis not present

## 2019-07-18 DIAGNOSIS — M961 Postlaminectomy syndrome, not elsewhere classified: Secondary | ICD-10-CM | POA: Diagnosis not present

## 2019-07-18 DIAGNOSIS — Z79899 Other long term (current) drug therapy: Secondary | ICD-10-CM | POA: Diagnosis not present

## 2019-07-29 DIAGNOSIS — L570 Actinic keratosis: Secondary | ICD-10-CM | POA: Diagnosis not present

## 2019-09-30 DIAGNOSIS — G629 Polyneuropathy, unspecified: Secondary | ICD-10-CM | POA: Diagnosis not present

## 2019-09-30 DIAGNOSIS — K219 Gastro-esophageal reflux disease without esophagitis: Secondary | ICD-10-CM | POA: Diagnosis not present

## 2019-09-30 DIAGNOSIS — E782 Mixed hyperlipidemia: Secondary | ICD-10-CM | POA: Diagnosis not present

## 2019-09-30 DIAGNOSIS — I10 Essential (primary) hypertension: Secondary | ICD-10-CM | POA: Diagnosis not present

## 2019-10-21 DIAGNOSIS — D3131 Benign neoplasm of right choroid: Secondary | ICD-10-CM | POA: Diagnosis not present

## 2019-10-21 DIAGNOSIS — H26491 Other secondary cataract, right eye: Secondary | ICD-10-CM | POA: Diagnosis not present

## 2019-11-04 DIAGNOSIS — H26491 Other secondary cataract, right eye: Secondary | ICD-10-CM | POA: Diagnosis not present

## 2019-11-06 DIAGNOSIS — G603 Idiopathic progressive neuropathy: Secondary | ICD-10-CM | POA: Diagnosis not present

## 2019-11-06 DIAGNOSIS — M5416 Radiculopathy, lumbar region: Secondary | ICD-10-CM | POA: Diagnosis not present

## 2019-11-06 DIAGNOSIS — M961 Postlaminectomy syndrome, not elsewhere classified: Secondary | ICD-10-CM | POA: Diagnosis not present

## 2021-01-18 DIAGNOSIS — M5416 Radiculopathy, lumbar region: Secondary | ICD-10-CM | POA: Diagnosis not present

## 2021-01-18 DIAGNOSIS — G603 Idiopathic progressive neuropathy: Secondary | ICD-10-CM | POA: Diagnosis not present

## 2021-01-18 DIAGNOSIS — M961 Postlaminectomy syndrome, not elsewhere classified: Secondary | ICD-10-CM | POA: Diagnosis not present

## 2021-01-18 DIAGNOSIS — Z79899 Other long term (current) drug therapy: Secondary | ICD-10-CM | POA: Diagnosis not present

## 2021-01-27 DIAGNOSIS — L570 Actinic keratosis: Secondary | ICD-10-CM | POA: Diagnosis not present

## 2021-01-27 DIAGNOSIS — L218 Other seborrheic dermatitis: Secondary | ICD-10-CM | POA: Diagnosis not present

## 2021-01-27 DIAGNOSIS — Z85828 Personal history of other malignant neoplasm of skin: Secondary | ICD-10-CM | POA: Diagnosis not present

## 2021-02-15 DIAGNOSIS — M5432 Sciatica, left side: Secondary | ICD-10-CM | POA: Diagnosis not present

## 2021-02-15 DIAGNOSIS — E782 Mixed hyperlipidemia: Secondary | ICD-10-CM | POA: Diagnosis not present

## 2021-02-15 DIAGNOSIS — G629 Polyneuropathy, unspecified: Secondary | ICD-10-CM | POA: Diagnosis not present

## 2021-02-15 DIAGNOSIS — M545 Low back pain, unspecified: Secondary | ICD-10-CM | POA: Diagnosis not present

## 2021-02-15 DIAGNOSIS — I10 Essential (primary) hypertension: Secondary | ICD-10-CM | POA: Diagnosis not present

## 2021-02-15 DIAGNOSIS — K2100 Gastro-esophageal reflux disease with esophagitis, without bleeding: Secondary | ICD-10-CM | POA: Diagnosis not present

## 2021-03-08 DIAGNOSIS — G603 Idiopathic progressive neuropathy: Secondary | ICD-10-CM | POA: Diagnosis not present

## 2021-03-08 DIAGNOSIS — M5416 Radiculopathy, lumbar region: Secondary | ICD-10-CM | POA: Diagnosis not present

## 2021-03-08 DIAGNOSIS — M961 Postlaminectomy syndrome, not elsewhere classified: Secondary | ICD-10-CM | POA: Diagnosis not present

## 2021-05-12 DIAGNOSIS — M961 Postlaminectomy syndrome, not elsewhere classified: Secondary | ICD-10-CM | POA: Diagnosis not present

## 2021-05-12 DIAGNOSIS — G603 Idiopathic progressive neuropathy: Secondary | ICD-10-CM | POA: Diagnosis not present

## 2021-05-12 DIAGNOSIS — M5416 Radiculopathy, lumbar region: Secondary | ICD-10-CM | POA: Diagnosis not present

## 2021-05-19 DIAGNOSIS — M545 Low back pain, unspecified: Secondary | ICD-10-CM | POA: Diagnosis not present

## 2021-05-19 DIAGNOSIS — E782 Mixed hyperlipidemia: Secondary | ICD-10-CM | POA: Diagnosis not present

## 2021-05-19 DIAGNOSIS — Z125 Encounter for screening for malignant neoplasm of prostate: Secondary | ICD-10-CM | POA: Diagnosis not present

## 2021-05-19 DIAGNOSIS — M5432 Sciatica, left side: Secondary | ICD-10-CM | POA: Diagnosis not present

## 2021-05-19 DIAGNOSIS — G629 Polyneuropathy, unspecified: Secondary | ICD-10-CM | POA: Diagnosis not present

## 2021-05-19 DIAGNOSIS — K2100 Gastro-esophageal reflux disease with esophagitis, without bleeding: Secondary | ICD-10-CM | POA: Diagnosis not present

## 2021-05-19 DIAGNOSIS — I10 Essential (primary) hypertension: Secondary | ICD-10-CM | POA: Diagnosis not present

## 2021-07-05 DIAGNOSIS — G603 Idiopathic progressive neuropathy: Secondary | ICD-10-CM | POA: Diagnosis not present

## 2021-07-05 DIAGNOSIS — M5416 Radiculopathy, lumbar region: Secondary | ICD-10-CM | POA: Diagnosis not present

## 2021-07-05 DIAGNOSIS — M961 Postlaminectomy syndrome, not elsewhere classified: Secondary | ICD-10-CM | POA: Diagnosis not present

## 2021-07-28 DIAGNOSIS — L570 Actinic keratosis: Secondary | ICD-10-CM | POA: Diagnosis not present

## 2021-08-19 DIAGNOSIS — K2100 Gastro-esophageal reflux disease with esophagitis, without bleeding: Secondary | ICD-10-CM | POA: Diagnosis not present

## 2021-08-19 DIAGNOSIS — M5432 Sciatica, left side: Secondary | ICD-10-CM | POA: Diagnosis not present

## 2021-08-19 DIAGNOSIS — E782 Mixed hyperlipidemia: Secondary | ICD-10-CM | POA: Diagnosis not present

## 2021-08-19 DIAGNOSIS — M545 Low back pain, unspecified: Secondary | ICD-10-CM | POA: Diagnosis not present

## 2021-08-19 DIAGNOSIS — I10 Essential (primary) hypertension: Secondary | ICD-10-CM | POA: Diagnosis not present

## 2021-08-19 DIAGNOSIS — G629 Polyneuropathy, unspecified: Secondary | ICD-10-CM | POA: Diagnosis not present

## 2021-08-25 DIAGNOSIS — G603 Idiopathic progressive neuropathy: Secondary | ICD-10-CM | POA: Diagnosis not present

## 2021-08-25 DIAGNOSIS — M5416 Radiculopathy, lumbar region: Secondary | ICD-10-CM | POA: Diagnosis not present

## 2021-08-25 DIAGNOSIS — M961 Postlaminectomy syndrome, not elsewhere classified: Secondary | ICD-10-CM | POA: Diagnosis not present

## 2021-10-20 DIAGNOSIS — M5416 Radiculopathy, lumbar region: Secondary | ICD-10-CM | POA: Diagnosis not present

## 2021-10-20 DIAGNOSIS — Z79899 Other long term (current) drug therapy: Secondary | ICD-10-CM | POA: Diagnosis not present

## 2021-10-20 DIAGNOSIS — M961 Postlaminectomy syndrome, not elsewhere classified: Secondary | ICD-10-CM | POA: Diagnosis not present

## 2021-10-20 DIAGNOSIS — G603 Idiopathic progressive neuropathy: Secondary | ICD-10-CM | POA: Diagnosis not present

## 2021-11-29 DIAGNOSIS — K2100 Gastro-esophageal reflux disease with esophagitis, without bleeding: Secondary | ICD-10-CM | POA: Diagnosis not present

## 2021-11-29 DIAGNOSIS — I10 Essential (primary) hypertension: Secondary | ICD-10-CM | POA: Diagnosis not present

## 2021-11-29 DIAGNOSIS — E782 Mixed hyperlipidemia: Secondary | ICD-10-CM | POA: Diagnosis not present

## 2021-11-29 DIAGNOSIS — G629 Polyneuropathy, unspecified: Secondary | ICD-10-CM | POA: Diagnosis not present

## 2021-11-29 DIAGNOSIS — M545 Low back pain, unspecified: Secondary | ICD-10-CM | POA: Diagnosis not present

## 2021-11-29 DIAGNOSIS — M5432 Sciatica, left side: Secondary | ICD-10-CM | POA: Diagnosis not present

## 2021-12-22 DIAGNOSIS — G603 Idiopathic progressive neuropathy: Secondary | ICD-10-CM | POA: Diagnosis not present

## 2021-12-22 DIAGNOSIS — M5416 Radiculopathy, lumbar region: Secondary | ICD-10-CM | POA: Diagnosis not present

## 2021-12-22 DIAGNOSIS — M961 Postlaminectomy syndrome, not elsewhere classified: Secondary | ICD-10-CM | POA: Diagnosis not present

## 2021-12-28 DIAGNOSIS — J40 Bronchitis, not specified as acute or chronic: Secondary | ICD-10-CM | POA: Diagnosis not present

## 2022-01-25 DIAGNOSIS — Z85828 Personal history of other malignant neoplasm of skin: Secondary | ICD-10-CM | POA: Diagnosis not present

## 2022-01-25 DIAGNOSIS — L218 Other seborrheic dermatitis: Secondary | ICD-10-CM | POA: Diagnosis not present

## 2022-01-25 DIAGNOSIS — L570 Actinic keratosis: Secondary | ICD-10-CM | POA: Diagnosis not present

## 2022-02-15 DIAGNOSIS — G603 Idiopathic progressive neuropathy: Secondary | ICD-10-CM | POA: Diagnosis not present

## 2022-02-15 DIAGNOSIS — M5416 Radiculopathy, lumbar region: Secondary | ICD-10-CM | POA: Diagnosis not present

## 2022-02-15 DIAGNOSIS — M961 Postlaminectomy syndrome, not elsewhere classified: Secondary | ICD-10-CM | POA: Diagnosis not present

## 2022-03-07 DIAGNOSIS — E782 Mixed hyperlipidemia: Secondary | ICD-10-CM | POA: Diagnosis not present

## 2022-03-07 DIAGNOSIS — K2100 Gastro-esophageal reflux disease with esophagitis, without bleeding: Secondary | ICD-10-CM | POA: Diagnosis not present

## 2022-03-07 DIAGNOSIS — R109 Unspecified abdominal pain: Secondary | ICD-10-CM | POA: Diagnosis not present

## 2022-03-07 DIAGNOSIS — G629 Polyneuropathy, unspecified: Secondary | ICD-10-CM | POA: Diagnosis not present

## 2022-03-07 DIAGNOSIS — I10 Essential (primary) hypertension: Secondary | ICD-10-CM | POA: Diagnosis not present

## 2022-03-18 DIAGNOSIS — R1084 Generalized abdominal pain: Secondary | ICD-10-CM | POA: Diagnosis not present

## 2022-04-04 DIAGNOSIS — M961 Postlaminectomy syndrome, not elsewhere classified: Secondary | ICD-10-CM | POA: Diagnosis not present

## 2022-04-04 DIAGNOSIS — M5416 Radiculopathy, lumbar region: Secondary | ICD-10-CM | POA: Diagnosis not present

## 2022-04-04 DIAGNOSIS — G603 Idiopathic progressive neuropathy: Secondary | ICD-10-CM | POA: Diagnosis not present

## 2022-05-30 DIAGNOSIS — R197 Diarrhea, unspecified: Secondary | ICD-10-CM | POA: Diagnosis not present

## 2022-06-01 ENCOUNTER — Encounter: Payer: Self-pay | Admitting: Internal Medicine

## 2022-06-02 DIAGNOSIS — M961 Postlaminectomy syndrome, not elsewhere classified: Secondary | ICD-10-CM | POA: Diagnosis not present

## 2022-06-02 DIAGNOSIS — M5416 Radiculopathy, lumbar region: Secondary | ICD-10-CM | POA: Diagnosis not present

## 2022-06-15 DIAGNOSIS — Z961 Presence of intraocular lens: Secondary | ICD-10-CM | POA: Diagnosis not present

## 2022-06-15 DIAGNOSIS — H26491 Other secondary cataract, right eye: Secondary | ICD-10-CM | POA: Diagnosis not present

## 2022-06-21 NOTE — Progress Notes (Unsigned)
Referring Provider: Toma Deiters, MD Primary Care Physician:  Toma Deiters, MD Primary Gastroenterologist:  Dr. Marletta Lor  No chief complaint on file.   HPI:   KHADER BURGY is a 76 y.o. male presenting today at the request of Hasanaj, Myra Gianotti, MD for diarrhea.  Past Medical History:  Diagnosis Date   Anxiety    Arthritis    Chronic back pain    Depression    GERD (gastroesophageal reflux disease)    History of kidney stones    Hyperlipidemia    Hypertension    Neuromuscular disorder (HCC)    neuropathy    Neuropathy     Past Surgical History:  Procedure Laterality Date   CATARACT EXTRACTION W/PHACO Right 10/28/2013   Procedure: CATARACT EXTRACTION PHACO AND INTRAOCULAR LENS PLACEMENT RIGHT EYE;  Surgeon: Gemma Payor, MD;  Location: [facility];  Service: Ophthalmology;  Laterality: Right;  CDE:  22.51   HAND / FINGER LESION EXCISION Right 70's   ring finger rt reattached   lower back fusion     TONSILLECTOMY     TOTAL KNEE ARTHROPLASTY Right 05/26/2014   Procedure: RIGHT TOTAL KNEE ARTHROPLASTY;  Surgeon: Loanne Drilling, MD;  Location: [facility];  Service: Orthopedics;  Laterality: Right;    Current Outpatient Medications  Medication Sig Dispense Refill   docusate sodium 100 MG CAPS Take 100 mg by mouth 2 (two) times daily. 10 capsule 0   DULoxetine (CYMBALTA) 60 MG capsule Take 60 mg by mouth 2 (two) times daily.     fluticasone (FLONASE) 50 MCG/ACT nasal spray Place 2 sprays into both nostrils daily.     gabapentin (NEURONTIN) 300 MG capsule Take 600 mg by mouth 3 (three) times daily.      lisinopril (PRINIVIL,ZESTRIL) 20 MG tablet Take 20 mg by mouth at bedtime.      methocarbamol (ROBAXIN) 500 MG tablet Take 1 tablet (500 mg total) by mouth every 6 (six) hours as needed for muscle spasms. 60 tablet 0   oxyCODONE (OXY IR/ROXICODONE) 5 MG immediate release tablet Take 1-4 tablets (5-20 mg total) by mouth every 3 (three) hours as needed for breakthrough pain. 90 tablet 0    PARoxetine (PAXIL) 20 MG tablet Take 20 mg by mouth every morning.     ranitidine (ZANTAC) 150 MG tablet Take 150 mg by mouth 2 (two) times daily.     rivaroxaban (XARELTO) 10 MG TABS tablet Take 1 tablet (10 mg total) by mouth daily with breakfast. 19 tablet 0   simvastatin (ZOCOR) 40 MG tablet Take 40 mg by mouth every evening.     traZODone (DESYREL) 50 MG tablet Take 150 mg by mouth at bedtime.     No current facility-administered medications for this visit.    Allergies as of 06/23/2022   (No Known Allergies)    No family history on file.  Social History   Socioeconomic History   Marital status: Married    Spouse name: Not on file   Number of children: Not on file   Years of education: Not on file   Highest education level: Not on file  Occupational History   Not on file  Tobacco Use   Smoking status: Former    Packs/day: 1.00    Years: 15.00    Total pack years: 15.00    Types: Cigarettes    Quit date: 04/25/2010    Years since quitting: 12.1   Smokeless tobacco: Never  Substance and Sexual Activity  Alcohol use: Yes    Comment: rare   Drug use: No   Sexual activity: Not on file  Other Topics Concern   Not on file  Social History Narrative   Not on file   Social Determinants of Health   Financial Resource Strain: Not on file  Food Insecurity: Not on file  Transportation Needs: Not on file  Physical Activity: Not on file  Stress: Not on file  Social Connections: Not on file  Intimate Partner Violence: Not on file    Review of Systems: Gen: Denies any fever, chills, cold or flulike symptoms, presyncope, syncope. CV: Denies chest pain, heart palpitations. Resp: Denies shortness of breath, cough. GI: see HPI GU : Denies urinary burning, urinary frequency, urinary hesitancy MS: Denies joint pain. Derm: Denies rash. Psych: Denies depression, anxiety. Heme: See HPI  Physical Exam: There were no vitals taken for this visit. General:   Alert and  oriented. Pleasant and cooperative. Well-nourished and well-developed.  Head:  Normocephalic and atraumatic. Eyes:  Without icterus, sclera clear and conjunctiva pink.  Ears:  Normal auditory acuity. Lungs:  Clear to auscultation bilaterally. No wheezes, rales, or rhonchi. No distress.  Heart:  S1, S2 present without murmurs appreciated.  Abdomen:  +BS, soft, non-tender and non-distended. No HSM noted. No guarding or rebound. No masses appreciated.  Rectal:  Deferred  Msk:  Symmetrical without gross deformities. Normal posture. Extremities:  Without edema. Neurologic:  Alert and  oriented x4;  grossly normal neurologically. Skin:  Intact without significant lesions or rashes. Psych:  Normal mood and affect.    Assessment:     Plan:  ***   Ermalinda Memos, PA-C [facility] 06/23/2022

## 2022-06-21 NOTE — H&P (View-Only) (Signed)
Referring Provider: Neale Burly, MD Primary Care Physician:  Neale Burly, MD Primary Gastroenterologist:  Dr. Abbey Chatters  Chief Complaint  Patient presents with   Diarrhea    Gas pains     HPI:   Ryan Ibarra is a 76 y.o. male presenting today at the request of Hasanaj, Samul Dada, MD for diarrhea.  Reviewed office visit with PCP dated 05/30/2022.  Stated patient continued with episodes of uncontrolled diarrhea lasting about 24 hours and usually occurring about once a week.  Can have up to 10 episodes.  He was prescribed dicyclomine and referred to GI for further evaluation.  Today:  New onset diarrhea in May. Prior to this, he had chronic constipation and took mineral oil capsules which usually worked well for him and allowed bowel movements daily.   On May 13, he developed acute onset diarrhea.  Symptoms started with foul-smelling gas, then diarrhea with significant urgency.  Reports he would have 15-15 bowel movements in a 24-hour period, nocturnal stools as well.  Reports bowel movements and significant gas almost immediately after eating during these episodes.  Denies any associated abdominal pain. He was having accidents due to significant urgency.  Taking 3-4 times a month Pepto suggested on the bottle during these episodes and symptoms resolved after about 48 hours.  Noticed dark stools while taking Pepto, but no BRBPR.  After diarrhea stopped, bowel habits returned to "normal" with soft, formed bowel movements daily.    He had a total of 6 episodes.  They occurred on May 13, 21 25, and June 8, 21, and 27.  He has not identified any trigger.  He was writing down on his calendar what he ate prior to onset, but there has been no rhyme or reason.  Denies any issues with dairy.    During his most recent episode on June 27, he is dicyclomine TID and Kaopectate which helped significantly.  Reports he did not have any diarrhea during this time.  No family history of IBD, celiac disease,  colon cancer.  Reports his last colonoscopy was 5 years or so ago at [facility] without polyps.  No weight loss.  No antibiotics, sick contacts, recent viral illness.  No medication changes.   Last colonoscopy 4-5 years ago at [facility]. No polyps.  No Fhx of IBD, colon cancer, celiac disease   Has history of heartburn that is well controlled on famotidine.  Past Medical History:  Diagnosis Date   Anxiety    Arthritis    Chronic back pain    Depression    GERD (gastroesophageal reflux disease)    History of kidney stones    Hyperlipidemia    Hypertension    Neuromuscular disorder (HCC)    neuropathy    Neuropathy     Past Surgical History:  Procedure Laterality Date   CATARACT EXTRACTION W/PHACO Right 10/28/2013   Procedure: CATARACT EXTRACTION PHACO AND INTRAOCULAR LENS PLACEMENT RIGHT EYE;  Surgeon: Tonny Branch, MD;  Location: [facility];  Service: Ophthalmology;  Laterality: Right;  CDE:  22.51   COLONOSCOPY     [place]   HAND / FINGER LESION EXCISION Right 70's   ring finger rt reattached   lower back fusion     TONSILLECTOMY     TOTAL KNEE ARTHROPLASTY Right 05/26/2014   Procedure: RIGHT TOTAL KNEE ARTHROPLASTY;  Surgeon: Gearlean Alf, MD;  Location: [facility];  Service: Orthopedics;  Laterality: Right;    Current Outpatient Medications  Medication Sig Dispense Refill  aspirin 81 MG chewable tablet Chew by mouth daily.     DULoxetine (CYMBALTA) 60 MG capsule Take 60 mg by mouth 2 (two) times daily.     famotidine (PEPCID) 20 MG tablet Take 20 mg by mouth daily.     fluticasone (FLONASE) 50 MCG/ACT nasal spray Place 2 sprays into both nostrils daily.     gabapentin (NEURONTIN) 300 MG capsule Take 600 mg by mouth 3 (three) times daily.      HYDROcodone-acetaminophen (NORCO) 10-325 MG tablet Take 1 tablet by mouth every 4 (four) hours as needed. Taking 6 a day.     lisinopril (PRINIVIL,ZESTRIL) 20 MG tablet Take 20 mg by mouth at bedtime.      Multiple Vitamin  (MULTIVITAMIN) tablet Take 1 tablet by mouth daily.     Omega-3 Fatty Acids (FISH OIL) 1000 MG CAPS Take by mouth.     simvastatin (ZOCOR) 40 MG tablet Take 40 mg by mouth every evening.     traZODone (DESYREL) 50 MG tablet Take 150 mg by mouth at bedtime.     No current facility-administered medications for this visit.    Allergies as of 06/23/2022   (No Known Allergies)    Family History  Problem Relation Age of Onset   Cancer - Colon Neg Hx    Inflammatory bowel disease Neg Hx    Celiac disease Neg Hx     Social History   Socioeconomic History   Marital status: Married    Spouse name: Not on file   Number of children: Not on file   Years of education: Not on file   Highest education level: Not on file  Occupational History   Not on file  Tobacco Use   Smoking status: Former    Packs/day: 1.00    Years: 15.00    Total pack years: 15.00    Types: Cigarettes    Quit date: 04/25/2010    Years since quitting: 12.1   Smokeless tobacco: Never  Substance and Sexual Activity   Alcohol use: Yes    Comment: rare   Drug use: No   Sexual activity: Not on file  Other Topics Concern   Not on file  Social History Narrative   Not on file   Social Determinants of Health   Financial Resource Strain: Not on file  Food Insecurity: Not on file  Transportation Needs: Not on file  Physical Activity: Not on file  Stress: Not on file  Social Connections: Not on file  Intimate Partner Violence: Not on file    Review of Systems: Gen: Denies any fever, chills, cold or flulike symptoms, presyncope, syncope. CV: Denies chest pain, heart palpitations. Resp: Denies shortness of breath, cough. GI: see HPI GU : Denies urinary burning, urinary frequency, urinary hesitancy MS: Denies joint pain. Derm: Denies rash. Psych: Denies depression, anxiety. Heme: See HPI  Physical Exam: BP (!) 150/82   Pulse 80   Temp 98.8 F (37.1 C) (Temporal)   Ht '5\' 10"'$  (1.778 m)   Wt 184 lb 3.2 oz  (83.6 kg)   BMI 26.43 kg/m  General:   Alert and oriented. Pleasant and cooperative. Well-nourished and well-developed.  Head:  Normocephalic and atraumatic. Eyes:  Without icterus, sclera clear and conjunctiva pink.  Ears:  Normal auditory acuity. Lungs:  Clear to auscultation bilaterally. No wheezes, rales, or rhonchi. No distress.  Heart:  S1, S2 present without murmurs appreciated.  Abdomen:  +BS, soft, non-tender and non-distended. No HSM noted. No guarding or  rebound. No masses appreciated.  Rectal:  Deferred  Msk:  Symmetrical without gross deformities. Normal posture. Extremities:  Without edema. Neurologic:  Alert and  oriented x4;  grossly normal neurologically. Skin:  Intact without significant lesions or rashes. Psych:  Normal mood and affect.    Assessment:  76 year old male with history of anxiety, depression, HTN, HLD, GERD, chronic back pain, presenting today for further evaluation of change in bowel habits.   Change in bowel habits with diarrhea: The patient reports acute change in bowel habits from constipation to intermittent uncontrollable diarrhea in May with up to 10-15 watery bowel movements in a 24-hour period, nocturnal stools. Stools are very foul smelling along with significant amount of foul smelling gas.  Symptoms would resolve after a couple of days and he would have soft, formed stools for several days, then symptoms will return.  He was taking large amounts of Pepto-Bismol during his diarrhea episodes and noted his stools were dark.  No BRBPR. He has had a total of 6 episodes, starting on May 13th and last on June 27th. PCP recently started him on dicyclomine as needed which helped significantly during his last episode. Denies unintentional weight loss, associated abdominal pain, recent antibiotics, sick contacts, medication changes, or recent viral illness.  No family history of IBD, colon cancer, or celiac disease.  Reports his last colonoscopy was 5 years or  so ago at [facility] without polyps.  Etiology of significant change in bowel habits is unclear.  We will check his thyroid function, screen for celiac disease, have him complete stool studies if watery diarrhea returns, and arrange for colonoscopy in the near future to rule out microscopic colitis and less likely IBD or malignancy.  Plan:  TSH, IgA, TTG IgA, GI profile panel, C. difficile PCR. Proceed with colonoscopy with propofol by Dr. Abbey Chatters in near future. The risks, benefits, and alternatives have been discussed with the patient in detail. The patient states understanding and desires to proceed. ASA 2 Okay to continue dicyclomine as needed. Follow-up after colonoscopy.   Aliene Altes, PA-C [facility] 06/23/2022

## 2022-06-23 ENCOUNTER — Encounter: Payer: Self-pay | Admitting: *Deleted

## 2022-06-23 ENCOUNTER — Ambulatory Visit (INDEPENDENT_AMBULATORY_CARE_PROVIDER_SITE_OTHER): Payer: Medicare Other | Admitting: Gastroenterology

## 2022-06-23 ENCOUNTER — Encounter: Payer: Self-pay | Admitting: Gastroenterology

## 2022-06-23 VITALS — BP 150/82 | HR 80 | Temp 98.8°F | Ht 70.0 in | Wt 184.2 lb

## 2022-06-23 DIAGNOSIS — R194 Change in bowel habit: Secondary | ICD-10-CM

## 2022-06-23 DIAGNOSIS — R197 Diarrhea, unspecified: Secondary | ICD-10-CM | POA: Insufficient documentation

## 2022-06-23 MED ORDER — PEG 3350-KCL-NA BICARB-NACL 420 G PO SOLR: ORAL | 0 refills | Status: DC

## 2022-06-23 NOTE — Patient Instructions (Signed)
Please have labs completed at [facility].  You will also pick up a stool sample kit.  If you have recurrent watery stools, please submit the sample back to [facility].  We will arrange for you to have a colonoscopy in the near future with Dr. Abbey Chatters to further evaluate your change in bowel habits.  We will plan to see you back in the office after your colonoscopy.  Ryan Altes, PA-C [facility]

## 2022-06-23 NOTE — Addendum Note (Signed)
Addended by: Orland Jarred on: 06/23/2022 03:06 PM   Modules accepted: Orders

## 2022-06-24 LAB — TSH: TSH: 0.912 u[IU]/mL (ref 0.450–4.500)

## 2022-06-24 LAB — IGA: IgA/Immunoglobulin A, Serum: 130 mg/dL (ref 61–437)

## 2022-06-24 LAB — TISSUE TRANSGLUTAMINASE, IGA: Transglutaminase IgA: <2 U/mL (ref 0–3)

## 2022-07-19 ENCOUNTER — Encounter (HOSPITAL_COMMUNITY): Payer: Self-pay

## 2022-07-19 ENCOUNTER — Ambulatory Visit (HOSPITAL_COMMUNITY)
Admission: RE | Admit: 2022-07-19 | Discharge: 2022-07-19 | Disposition: A | Discharge status: Home / Self Care | Payer: Medicare Other | Attending: Internal Medicine | Admitting: Internal Medicine

## 2022-07-19 ENCOUNTER — Other Ambulatory Visit: Payer: Self-pay

## 2022-07-19 ENCOUNTER — Ambulatory Visit (HOSPITAL_COMMUNITY): Payer: Medicare Other | Admitting: Anesthesiology

## 2022-07-19 ENCOUNTER — Ambulatory Visit (HOSPITAL_BASED_OUTPATIENT_CLINIC_OR_DEPARTMENT_OTHER): Payer: Medicare Other | Admitting: Anesthesiology

## 2022-07-19 ENCOUNTER — Encounter (HOSPITAL_COMMUNITY)
Admission: RE | Disposition: A | Discharge status: Home / Self Care | Payer: Self-pay | Source: Home / Self Care | Attending: Internal Medicine

## 2022-07-19 DIAGNOSIS — K5730 Diverticulosis of large intestine without perforation or abscess without bleeding: Secondary | ICD-10-CM | POA: Insufficient documentation

## 2022-07-19 DIAGNOSIS — Z87891 Personal history of nicotine dependence: Secondary | ICD-10-CM | POA: Diagnosis not present

## 2022-07-19 DIAGNOSIS — K648 Other hemorrhoids: Secondary | ICD-10-CM

## 2022-07-19 DIAGNOSIS — K529 Noninfective gastroenteritis and colitis, unspecified: Secondary | ICD-10-CM

## 2022-07-19 DIAGNOSIS — F418 Other specified anxiety disorders: Secondary | ICD-10-CM | POA: Diagnosis not present

## 2022-07-19 DIAGNOSIS — Z79899 Other long term (current) drug therapy: Secondary | ICD-10-CM | POA: Insufficient documentation

## 2022-07-19 DIAGNOSIS — R197 Diarrhea, unspecified: Secondary | ICD-10-CM | POA: Diagnosis not present

## 2022-07-19 DIAGNOSIS — K219 Gastro-esophageal reflux disease without esophagitis: Secondary | ICD-10-CM | POA: Insufficient documentation

## 2022-07-19 DIAGNOSIS — F32A Depression, unspecified: Secondary | ICD-10-CM | POA: Insufficient documentation

## 2022-07-19 DIAGNOSIS — G709 Myoneural disorder, unspecified: Secondary | ICD-10-CM | POA: Insufficient documentation

## 2022-07-19 HISTORY — PX: COLONOSCOPY WITH PROPOFOL: SHX5780

## 2022-07-19 HISTORY — PX: BIOPSY: SHX5522

## 2022-07-19 SURGERY — COLONOSCOPY WITH PROPOFOL
Anesthesia: General

## 2022-07-19 MED ORDER — PROPOFOL 500 MG/50ML IV EMUL
INTRAVENOUS | Status: DC | PRN
  Administered 2022-07-19: 200 ug/kg/min via INTRAVENOUS

## 2022-07-19 MED ORDER — PROPOFOL 10 MG/ML IV BOLUS
INTRAVENOUS | Status: DC | PRN
  Administered 2022-07-19: 20 mg via INTRAVENOUS

## 2022-07-19 MED ORDER — LIDOCAINE HCL (CARDIAC) PF 100 MG/5ML IV SOSY
PREFILLED_SYRINGE | INTRAVENOUS | Status: DC | PRN
  Administered 2022-07-19: 50 mg via INTRAVENOUS

## 2022-07-19 MED ORDER — LACTATED RINGERS IV SOLN: INTRAVENOUS | Status: DC

## 2022-07-19 NOTE — Op Note (Signed)
[facility] Patient Name: Ryan Ibarra Procedure Date: 07/19/2022 11:28 AM MRN: [id number] Date of Birth: [birth year] Attending MD: [name] DO CSN: [id number] Age: 76 Admit Type: Outpatient Procedure:                Colonoscopy Indications:              Chronic diarrhea Providers:                [name], DO, Tammy Vaught, RN, Randa Spike, Technician, Ladoris Gene, Technician Referring MD:              Medicines:                See the Anesthesia note for documentation of the                            administered medications Complications:            No immediate complications. Estimated Blood Loss:     Estimated blood loss was minimal. Procedure:                Pre-Anesthesia Assessment:                           - The anesthesia plan was to use monitored                            anesthesia care (MAC).                           After obtaining informed consent, the colonoscope                            was passed under direct vision. Throughout the                            procedure, the patient's blood pressure, pulse, and                            oxygen saturations were monitored continuously. The                            [id number]-HQ190L ([id number]) scope was introduced through                            the anus and advanced to the the cecum, identified                            by appendiceal orifice and ileocecal valve. The                            colonoscopy was performed without difficulty. The                            patient tolerated the procedure well. The  quality                            of the bowel preparation was evaluated using the                            BBPS [facility] Bowel Preparation Scale) with scores                            of: Right Colon = 2 (minor amount of residual                            staining, small fragments of stool and/or opaque                            liquid, but mucosa seen  well), Transverse Colon = 2                            (minor amount of residual staining, small fragments                            of stool and/or opaque liquid, but mucosa seen                            well) and Left Colon = 2 (minor amount of residual                            staining, small fragments of stool and/or opaque                            liquid, but mucosa seen well). The total BBPS score                            equals 6. The quality of the bowel preparation was                            fair. Scope In: 11:55:20 AM Scope Out: 12:19:39 PM Scope Withdrawal Time: 0 hours 19 minutes 10 seconds  Total Procedure Duration: 0 hours 24 minutes 19 seconds  Findings:      The perianal and digital rectal examinations were normal.      Non-bleeding internal hemorrhoids were found during endoscopy.      Multiple small-mouthed diverticula were found in the sigmoid colon.      Biopsies for histology were taken with a cold forceps from the ascending       colon, transverse colon and descending colon for evaluation of       microscopic colitis.      The exam was otherwise without abnormality. Impression:               - Preparation of the colon was fair.                           - Non-bleeding internal hemorrhoids.                           -  Diverticulosis in the sigmoid colon.                           - The examination was otherwise normal.                           - Biopsies were taken with a cold forceps from the                            ascending colon, transverse colon and descending                            colon for evaluation of microscopic colitis. Moderate Sedation:      Per Anesthesia Care Recommendation:           - Patient has a contact number available for                            emergencies. The signs and symptoms of potential                            delayed complications were discussed with the                            patient. Return to normal  activities tomorrow.                            Written discharge instructions were provided to the                            patient.                           - Resume previous diet.                           - Continue present medications.                           - Await pathology results.                           - Repeat colonoscopy in 10 years for screening                            purposes.                           - Return to GI clinic in 4 months. Procedure Code(s):        --- Professional ---                           [phone], Colonoscopy, flexible; with biopsy, single                            or multiple Diagnosis Code(s):        ---  Professional ---                           K64.8, Other hemorrhoids                           K52.9, Noninfective gastroenteritis and colitis,                            unspecified                           K57.30, Diverticulosis of large intestine without                            perforation or abscess without bleeding CPT copyright 2019 American Medical Association. All rights reserved. The codes documented in this report are preliminary and upon coder review may  be revised to meet current compliance requirements. [name], DO [place] Abbey Chatters, DO 07/19/2022 12:23:34 PM This report has been signed electronically. Number of Addenda: 0

## 2022-07-19 NOTE — Interval H&P Note (Signed)
History and Physical Interval Note:  07/19/2022 10:52 AM  Ryan Ibarra  has presented today for surgery, with the diagnosis of diarrhea.  The various methods of treatment have been discussed with the patient and family. After consideration of risks, benefits and other options for treatment, the patient has consented to  Procedure(s) with comments: COLONOSCOPY WITH PROPOFOL (N/A) - 11:45am as a surgical intervention.  The patient's history has been reviewed, patient examined, no change in status, stable for surgery.  I have reviewed the patient's chart and labs.  Questions were answered to the patient's satisfaction.     Eloise Harman

## 2022-07-19 NOTE — Anesthesia Preprocedure Evaluation (Signed)
Anesthesia Evaluation  Patient identified by MRN, date of birth, ID band Patient awake    Reviewed: Allergy & Precautions, H&P , NPO status , Patient's Chart, lab work & pertinent test results, reviewed documented beta blocker date and time   Airway Mallampati: II  TM Distance: >3 FB Neck ROM: full    Dental no notable dental hx.    Pulmonary neg pulmonary ROS, former smoker,    Pulmonary exam normal breath sounds clear to auscultation       Cardiovascular Exercise Tolerance: Good hypertension, negative cardio ROS   Rhythm:regular Rate:Normal     Neuro/Psych PSYCHIATRIC DISORDERS Anxiety Depression  Neuromuscular disease    GI/Hepatic Neg liver ROS, GERD  Medicated,  Endo/Other  negative endocrine ROS  Renal/GU negative Renal ROS  negative genitourinary   Musculoskeletal   Abdominal   Peds  Hematology negative hematology ROS (+)   Anesthesia Other Findings   Reproductive/Obstetrics negative OB ROS                             Anesthesia Physical Anesthesia Plan  ASA: 2  Anesthesia Plan: General   Post-op Pain Management:    Induction:   PONV Risk Score and Plan: Propofol infusion  Airway Management Planned:   Additional Equipment:   Intra-op Plan:   Post-operative Plan:   Informed Consent: I have reviewed the patients History and Physical, chart, labs and discussed the procedure including the risks, benefits and alternatives for the proposed anesthesia with the patient or authorized representative who has indicated his/her understanding and acceptance.     Dental Advisory Given  Plan Discussed with: CRNA  Anesthesia Plan Comments:         Anesthesia Quick Evaluation

## 2022-07-19 NOTE — Discharge Instructions (Addendum)
  Colonoscopy Discharge Instructions  Read the instructions outlined below and refer to this sheet in the next few weeks. These discharge instructions provide you with general information on caring for yourself after you leave the hospital. Your doctor may also give you specific instructions. While your treatment has been planned according to the most current medical practices available, unavoidable complications occasionally occur.   ACTIVITY You may resume your regular activity, but move at a slower pace for the next 24 hours.  Take frequent rest periods for the next 24 hours.  Walking will help get rid of the air and reduce the bloated feeling in your belly (abdomen).  No driving for 24 hours (because of the medicine (anesthesia) used during the test).   Do not sign any important legal documents or operate any machinery for 24 hours (because of the anesthesia used during the test).  NUTRITION Drink plenty of fluids.  You may resume your normal diet as instructed by your doctor.  Begin with a light meal and progress to your normal diet. Heavy or fried foods are harder to digest and may make you feel sick to your stomach (nauseated).  Avoid alcoholic beverages for 24 hours or as instructed.  MEDICATIONS You may resume your normal medications unless your doctor tells you otherwise.  WHAT YOU CAN EXPECT TODAY Some feelings of bloating in the abdomen.  Passage of more gas than usual.  Spotting of blood in your stool or on the toilet paper.  IF YOU HAD POLYPS REMOVED DURING THE COLONOSCOPY: No aspirin products for 7 days or as instructed.  No alcohol for 7 days or as instructed.  Eat a soft diet for the next 24 hours.  FINDING OUT THE RESULTS OF YOUR TEST Not all test results are available during your visit. If your test results are not back during the visit, make an appointment with your caregiver to find out the results. Do not assume everything is normal if you have not heard from your  caregiver or the medical facility. It is important for you to follow up on all of your test results.  SEEK IMMEDIATE MEDICAL ATTENTION IF: You have more than a spotting of blood in your stool.  Your belly is swollen (abdominal distention).  You are nauseated or vomiting.  You have a temperature over 101.  You have abdominal pain or discomfort that is severe or gets worse throughout the day.   Your colonoscopy was relatively unremarkable.  I did not find any polyps or evidence of colon cancer.  I recommend repeating colonoscopy in 10 years for colon cancer screening purposes.    You do have diverticulosis and internal hemorrhoids. I would recommend increasing fiber in your diet or adding OTC Benefiber/Metamucil. Be sure to drink at least 4 to 6 glasses of water daily.   I did not see any active inflammation indicative of underlying Crohn's disease or ulcerative colitis.  I did take biopsies throughout your whole colon.  Await pathology results, my office will contact you.  Follow-up with GI in 3 to 4 months.  MESSAGE LEFT AT OFFICE TO SCHEDULE FOLLOW UP APPOINTMENT   I hope you have a great rest of your week!  [name], D.O. Gastroenterology and Hepatology [facility]

## 2022-07-19 NOTE — Transfer of Care (Signed)
Immediate Anesthesia Transfer of Care Note  Patient: Ryan Ibarra  Procedure(s) Performed: COLONOSCOPY WITH PROPOFOL BIOPSY  Patient Location: PACU  Anesthesia Type:MAC  Level of Consciousness: drowsy  Airway & Oxygen Therapy: Patient Spontanous Breathing  Post-op Assessment: Report given to RN and Post -op Vital signs reviewed and stable  Post vital signs: Reviewed and stable  Last Vitals:  Vitals Value Taken Time  BP 99/60 07/19/22 1223  Temp    Pulse 56 07/19/22 1224  Resp 13 07/19/22 1224  SpO2 98 % 07/19/22 1224  Vitals shown include unvalidated device data.  Last Pain:  Vitals:   07/19/22 1142  TempSrc:   PainSc: 0-No pain      Patients Stated Pain Goal: 5 (31/43/88 [id number])  Complications: No notable events documented.

## 2022-07-20 LAB — SURGICAL PATHOLOGY

## 2022-07-20 NOTE — Anesthesia Postprocedure Evaluation (Signed)
Anesthesia Post Note  Patient: Ryan Ibarra  Procedure(s) Performed: COLONOSCOPY WITH PROPOFOL BIOPSY  Patient location during evaluation: Phase II Anesthesia Type: General Level of consciousness: awake Pain management: pain level controlled Vital Signs Assessment: post-procedure vital signs reviewed and stable Respiratory status: spontaneous breathing and respiratory function stable Cardiovascular status: blood pressure returned to baseline and stable Postop Assessment: no headache and no apparent nausea or vomiting Anesthetic complications: no Comments: Late entry   No notable events documented.   Last Vitals:  Vitals:   07/19/22 1222 07/19/22 1227  BP: 99/68 108/68  Pulse: (!) 55 64  Resp: 10 18  Temp: 36.6 C   SpO2: 98% 98%    Last Pain:  Vitals:   07/19/22 1227  TempSrc:   PainSc: 0-No pain                 Louann Sjogren

## 2022-07-26 ENCOUNTER — Encounter (HOSPITAL_COMMUNITY): Payer: Self-pay | Admitting: Internal Medicine

## 2022-08-01 DIAGNOSIS — M5416 Radiculopathy, lumbar region: Secondary | ICD-10-CM | POA: Diagnosis not present

## 2022-08-01 DIAGNOSIS — G603 Idiopathic progressive neuropathy: Secondary | ICD-10-CM | POA: Diagnosis not present

## 2022-08-01 DIAGNOSIS — M961 Postlaminectomy syndrome, not elsewhere classified: Secondary | ICD-10-CM | POA: Diagnosis not present

## 2022-08-02 DIAGNOSIS — L570 Actinic keratosis: Secondary | ICD-10-CM | POA: Diagnosis not present

## 2022-08-02 DIAGNOSIS — D485 Neoplasm of uncertain behavior of skin: Secondary | ICD-10-CM | POA: Diagnosis not present

## 2022-08-02 DIAGNOSIS — L820 Inflamed seborrheic keratosis: Secondary | ICD-10-CM | POA: Diagnosis not present

## 2022-09-05 DIAGNOSIS — Z125 Encounter for screening for malignant neoplasm of prostate: Secondary | ICD-10-CM | POA: Diagnosis not present

## 2022-09-05 DIAGNOSIS — K2100 Gastro-esophageal reflux disease with esophagitis, without bleeding: Secondary | ICD-10-CM | POA: Diagnosis not present

## 2022-09-05 DIAGNOSIS — E782 Mixed hyperlipidemia: Secondary | ICD-10-CM | POA: Diagnosis not present

## 2022-09-05 DIAGNOSIS — I10 Essential (primary) hypertension: Secondary | ICD-10-CM | POA: Diagnosis not present

## 2022-09-13 DIAGNOSIS — Z23 Encounter for immunization: Secondary | ICD-10-CM | POA: Diagnosis not present

## 2022-09-28 DIAGNOSIS — M5416 Radiculopathy, lumbar region: Secondary | ICD-10-CM | POA: Diagnosis not present

## 2022-09-28 DIAGNOSIS — Z79899 Other long term (current) drug therapy: Secondary | ICD-10-CM | POA: Diagnosis not present

## 2022-09-28 DIAGNOSIS — G603 Idiopathic progressive neuropathy: Secondary | ICD-10-CM | POA: Diagnosis not present

## 2022-09-28 DIAGNOSIS — M961 Postlaminectomy syndrome, not elsewhere classified: Secondary | ICD-10-CM | POA: Diagnosis not present

## 2022-10-08 NOTE — Progress Notes (Unsigned)
Referring Provider: Neale Burly, MD Primary Care Physician:  Neale Burly, MD Primary GI Physician: Dr. Abbey Chatters  Chief Complaint  Patient presents with   Follow-up    Still having diarrhea. Not as bad as before     HPI:   Ryan Ibarra is a 76 y.o. male presenting today for follow-up of change in bowel habits with diarrhea s/p colonoscopy.  Last seen in our office at the time of initial consult on 06/23/2022.  He reported acute change in bowel habits from constipation to intermittent uncontrollable diarrhea in May 2023 with up to 10-15 watery bowel movements in a 24-hour period along with nocturnal stools.  Stools were foul-smelling with significant amount of foul-smelling gas.  Symptoms would resolve after couple days he would have soft, formed stools for several days, then symptoms returned.  He was taking large amounts of Pepto-Bismol during his diarrhea episodes and subsequently had dark stools.  No rectal bleeding.  He had a total of 6 separate episodes with last on June 27.  PCP had started him on dicyclomine as needed which did help significantly during last flare.  No associated abdominal pain.  Denied recent antibiotics, medication changes, recent illnesses, family history of IBD or colon cancer.  Recommended checking for celiac disease, thyroid abnormalities, and completing stool studies of watery diarrhea returned.  We would also proceed with a colonoscopy for further evaluation.  TSH and celiac screen within normal limits.  Stool studies not completed.  Colonoscopy 07/19/2022:  - Non-bleeding internal hemorrhoids. - Diverticulosis in the sigmoid colon. - Biopsies were taken with a cold forceps from the ascending colon, transverse colon and descending colon for evaluation of microscopic colitis. -Pathology was benign.  Today:  Dramatic improvement. Only 1 episode of diarrhea since colonoscopy. Took some sort of over the counter anti diarrheal which helped. Bowels move  every 3rd day. No hard stools. Doesn't feel constipated. Continues to have a lot of gas after eating. Little upper abdominal discomfort after drinking a coke first thing in the morning. Otherwise, no abdominal pain. Last a couple of minutes.    Drinks about 10 soda a day. Eats a lot of sandwiches, meat, french fries. Doesn't eat many vegetables. Eats a lot of bananas. Tried OTC gas relief. Reduces gas about 50%.   Stools are dark on iron which was started about 1 month ago due to starting methotrexate for RA which is a new diagnosis for him. Denies history of anemia. No heartburn, nausea, vomiting, or dysphagia.   No longer taking dicyclomine.  No NSAIDs.    Past Medical History:  Diagnosis Date   Anxiety    Arthritis    Chronic back pain    Depression    GERD (gastroesophageal reflux disease)    History of kidney stones    Hyperlipidemia    Hypertension    Neuromuscular disorder (HCC)    neuropathy    Neuropathy    RA (rheumatoid arthritis) ([place])     Past Surgical History:  Procedure Laterality Date   BIOPSY  07/19/2022   Procedure: BIOPSY;  Surgeon: Eloise Harman, DO;  [facility];  Service: Endoscopy;;   CATARACT EXTRACTION W/PHACO Right 10/28/2013   Procedure: CATARACT EXTRACTION PHACO AND INTRAOCULAR LENS PLACEMENT RIGHT EYE;  Surgeon: Tonny Branch, MD;  Location: [facility];  Service: Ophthalmology;  Laterality: Right;  CDE:  22.51   COLONOSCOPY     [place]   COLONOSCOPY WITH PROPOFOL N/A 07/19/2022   Surgeon:  Eloise Harman, DO;   nonbleeding internal hemorrhoids, diverticulosis in the sigmoid colon, normal-appearing colon, random colon biopsies negative for microscopic colitis.   HAND / FINGER LESION EXCISION Right 70's   ring finger rt reattached   lower back fusion     TONSILLECTOMY     TOTAL KNEE ARTHROPLASTY Right 05/26/2014   Procedure: RIGHT TOTAL KNEE ARTHROPLASTY;  Surgeon: Gearlean Alf, MD;  Location: [facility];  Service: Orthopedics;   Laterality: Right;    Current Outpatient Medications  Medication Sig Dispense Refill   aspirin EC 81 MG tablet Take 81 mg by mouth daily.     DULoxetine (CYMBALTA) 60 MG capsule Take 60 mg by mouth 2 (two) times daily.     famotidine (PEPCID) 20 MG tablet Take 20 mg by mouth daily.     fluticasone (FLONASE) 50 MCG/ACT nasal spray Place 2 sprays into both nostrils daily.     folic acid (FOLVITE) 1 MG tablet Take 1 mg by mouth daily.     gabapentin (NEURONTIN) 800 MG tablet Take 800 mg by mouth 5 (five) times daily as needed (pain).     HYDROcodone-acetaminophen (NORCO) 10-325 MG tablet Take 1 tablet by mouth every 4 (four) hours as needed for severe pain.     hydrocortisone 2.5 % ointment Apply 1 Application topically daily as needed for itching.     ketoconazole (NIZORAL) 2 % cream Apply 1 Application topically 2 (two) times daily as needed for irritation.     lisinopril (PRINIVIL,ZESTRIL) 20 MG tablet Take 20 mg by mouth at bedtime.      methotrexate (RHEUMATREX) 2.5 MG tablet Take 15 mg by mouth once a week.     Multiple Vitamin (MULTIVITAMIN) tablet Take 1 tablet by mouth daily.     Omega-3 Fatty Acids (FISH OIL) 1000 MG CAPS Take 1,000 mg by mouth in the morning, at noon, and at bedtime.     simvastatin (ZOCOR) 40 MG tablet Take 40 mg by mouth every evening.     traZODone (DESYREL) 50 MG tablet Take 150 mg by mouth at bedtime.     No current facility-administered medications for this visit.    Allergies as of 10/10/2022   (No Known Allergies)    Family History  Problem Relation Age of Onset   Cancer - Colon Neg Hx    Inflammatory bowel disease Neg Hx    Celiac disease Neg Hx     Social History   Socioeconomic History   Marital status: Married    Spouse name: Not on file   Number of children: Not on file   Years of education: Not on file   Highest education level: Not on file  Occupational History   Not on file  Tobacco Use   Smoking status: Former    Packs/day: 1.00     Years: 15.00    Total pack years: 15.00    Types: Cigarettes    Quit date: 04/25/2010    Years since quitting: 12.4   Smokeless tobacco: Never  Vaping Use   Vaping Use: Never used  Substance and Sexual Activity   Alcohol use: Yes    Comment: rare   Drug use: No   Sexual activity: Not on file  Other Topics Concern   Not on file  Social History Narrative   Not on file   Social Determinants of Health   Financial Resource Strain: Not on file  Food Insecurity: Not on file  Transportation Needs: Not on file  Physical  Activity: Not on file  Stress: Not on file  Social Connections: Not on file    Review of Systems: Gen: Denies fever, chills, cold or flu like symptoms, pre-syncope, or syncope.  CV: Denies chest pain, palpitations. Resp: Denies dyspnea, cough.  GI: See HPI Heme: See HPI  Physical Exam: BP 131/85 (BP Location: Right Arm, Patient Position: Sitting, Cuff Size: Normal)   Pulse 83   Temp 97.6 F (36.4 C) (Temporal)   Ht '5\' 9"'$  (1.753 m)   Wt 182 lb (82.6 kg)   SpO2 99%   BMI 26.88 kg/m  General:   Alert and oriented. No distress noted. Pleasant and cooperative.  Head:  Normocephalic and atraumatic. Eyes:  Conjuctiva clear without scleral icterus. Heart:  S1, S2 present without murmurs appreciated. Lungs:  Clear to auscultation bilaterally. No wheezes, rales, or rhonchi. No distress.  Abdomen:  +BS, soft, and non-distended. Mild epigastric TTP. No rebound or guarding. No HSM or masses noted. Msk:  Symmetrical without gross deformities. Normal posture. Extremities:  Without edema. Neurologic:  Alert and  oriented x4 Psych:  Normal mood and affect.    Assessment:  76 year old male with history of HTN, HLD, depression, anxiety, neuropathy, GERD, RA, presenting today for follow-up of change in bowel habits with diarrhea s/p colonoscopy.   Change in bowel habits/diarrhea/gas/flatulence: Patient experienced acute change in bowel habits from constipation to  intermittent uncontrollable diarrhea in May 2023.  Denied associated abdominal pain.  He was started on dicyclomine as needed which helped significantly.  Colonoscopy 07/19/2022 with nonbleeding internal hemorrhoids, diverticulosis in the sigmoid colon, normal-appearing colon, random colon biopsies negative for microscopic colitis.  TSH and celiac screen also within normal limits. Clinically, symptoms have significantly improved. Only with 1 diarrhea episode since August. However, he does continue to have quite a bit of gas postprandially.   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  Suspect gas/flatulence is likely influenced by dietary choices, drinking 10 soda daily.  Unable to rule out SIBO.  We will plan to treat supportively for now.  If symptoms do not improve, consider empiric treatment with antibiotics for SIBO.   Plan:  Work towards complete elimination of soda.  Use Simethicone (Gas-x) as needed.  Try Beano before meals Start daily Probiotic.   If the above recommendations are not helpful, recommended low FODMAP diet. Separate handout provided.  Follow-up in 3 months. If persistent gas/bloating, consider treatment for SIBO.    Aliene Altes, PA-C [facility] 10/10/2022

## 2022-10-10 ENCOUNTER — Encounter: Payer: Self-pay | Admitting: Gastroenterology

## 2022-10-10 ENCOUNTER — Ambulatory Visit (INDEPENDENT_AMBULATORY_CARE_PROVIDER_SITE_OTHER): Payer: Medicare Other | Admitting: Gastroenterology

## 2022-10-10 VITALS — BP 131/85 | HR 83 | Temp 97.6°F | Ht 69.0 in | Wt 182.0 lb

## 2022-10-10 DIAGNOSIS — R197 Diarrhea, unspecified: Secondary | ICD-10-CM

## 2022-10-10 DIAGNOSIS — R143 Flatulence: Secondary | ICD-10-CM | POA: Diagnosis not present

## 2022-10-10 NOTE — Patient Instructions (Addendum)
I am glad your diarrhea has resolved.   To help with gas/flatulence:  Work towards complete elimination of soda.  Use Simethicone (Gas-x) as needed.  Try Beano before meals Start daily Probiotic.  If you could options include restore, digestive advantage, Hardin Negus' colon health, align.  If the above recommendations are not helpful, you can try following a low FODMAP diet.  Please see separate handout.  This is not meant to be a permanent diet but to help you identify food triggers.  Follow the strict low FODMAP diet for 2-4 weeks.  If symptoms improve, start to add 1 food item back every 4 to 5 days and monitor for recurrent gas/bloating.  We will plan to follow-up with you in the office in 3 months or sooner if needed.  It was great to see you again today!  Aliene Altes, PA-C [facility]

## 2022-11-22 DIAGNOSIS — M961 Postlaminectomy syndrome, not elsewhere classified: Secondary | ICD-10-CM | POA: Diagnosis not present

## 2022-11-22 DIAGNOSIS — G603 Idiopathic progressive neuropathy: Secondary | ICD-10-CM | POA: Diagnosis not present

## 2022-11-22 DIAGNOSIS — M5416 Radiculopathy, lumbar region: Secondary | ICD-10-CM | POA: Diagnosis not present

## 2022-12-26 DIAGNOSIS — M05642 Rheumatoid arthritis of left hand with involvement of other organs and systems: Secondary | ICD-10-CM | POA: Diagnosis not present

## 2022-12-26 DIAGNOSIS — M5432 Sciatica, left side: Secondary | ICD-10-CM | POA: Diagnosis not present

## 2022-12-26 DIAGNOSIS — G629 Polyneuropathy, unspecified: Secondary | ICD-10-CM | POA: Diagnosis not present

## 2022-12-26 DIAGNOSIS — K2100 Gastro-esophageal reflux disease with esophagitis, without bleeding: Secondary | ICD-10-CM | POA: Diagnosis not present

## 2022-12-26 DIAGNOSIS — E782 Mixed hyperlipidemia: Secondary | ICD-10-CM | POA: Diagnosis not present

## 2022-12-26 DIAGNOSIS — I10 Essential (primary) hypertension: Secondary | ICD-10-CM | POA: Diagnosis not present

## 2023-01-08 NOTE — Progress Notes (Signed)
GI Office Note    Referring Provider: Neale Burly, MD Primary Care Physician:  Neale Burly, MD Primary Gastroenterologist: Ryan Alas. Abbey Chatters, Ryan Ibarra  Date:  01/09/2023  ID:  Ryan Ibarra, DOB October 31, 1946, MRN [id number]   Chief Complaint   Chief Complaint  Patient presents with   Follow-up    Still having diarrhea. Pain in lower right side when he drinks coffee or eats.    History of Present Illness  Ryan Ibarra is a 77 y.o. male with a history of diarrhea, anxiety, depression, GERD, HLD, HTN, neuropathy, chronic back pain, RA presenting today with for follow up.  Previous TSH and celiac screen normal. No prior stool studies.   Colonoscopy 07/19/22: -non-bleeding internal hemorrhoids -sigmoid diverticulosis -pathology with random biopsies benign  Last office visit 10/10/22. F/u for change in bowel habits/diarrhea s/p colonoscopy. Dramatic improvement of diarrhea with otc antidiarrheal. Denied constipation. Continues to have gas after eating. Some upper abdominal discomfort after coke. Admits to 10 sodas/day, sandwiches, meat, french fries. Also lots of bananas. Stools dark since being on iron. Also on methotrexate for RA. Denied NSAIDs. Not taking dicyclomine. Gas-ex advised as needed. Advised to try Beano before meals. Start daily probiotic. Advised f/u in 3 months. Low FODMAP if previous recommendations not helpful. Consider empiric treatment for SIBO.   Today: Has diarrhea every other week for abut 3-4 days. May go 2-3 times per day. Has been taking pepto bismol to help cut back on episodes and sometimes takes pepto before he eats. Not taking dicyclomine. May have a BM every other day or every 3rd day in between but depends on what he eats.   Has tried no soda (used to drink about 10/day). Reports if he eats something he has to have a BM and has a lto of gas therefore he usually tries to eat only 1 BM/day. When he passes gas he will have some stool come out. Gas starts the  minute he eats something and then he gets loose stools.  No N/V. Rare lower abdominal pain. Has occasional gas pains.   Pain in the right side and worse when he drinks coffee. On chronic pain medications, takes 6 oxycodone per day.   Has tried pectin tablets. Has only done pepto no imodium. States pepto works. Stools are dark with pepto and iron.  Not happy about time frame between visits.    Current Outpatient Medications  Medication Sig Dispense Refill   aspirin EC 81 MG tablet Take 81 mg by mouth daily.     DULoxetine (CYMBALTA) 60 MG capsule Take 60 mg by mouth 2 (two) times daily.     famotidine (PEPCID) 20 MG tablet Take 20 mg by mouth daily.     fluticasone (FLONASE) 50 MCG/ACT nasal spray Place 2 sprays into both nostrils daily.     folic acid (FOLVITE) 1 MG tablet Take 1 mg by mouth daily.     gabapentin (NEURONTIN) 800 MG tablet Take 800 mg by mouth 5 (five) times daily as needed (pain).     HYDROcodone-acetaminophen (NORCO) 10-325 MG tablet Take 1 tablet by mouth every 4 (four) hours as needed for severe pain.     hydrocortisone 2.5 % ointment Apply 1 Application topically daily as needed for itching.     ketoconazole (NIZORAL) 2 % cream Apply 1 Application topically 2 (two) times daily as needed for irritation.     lisinopril (PRINIVIL,ZESTRIL) 20 MG tablet Take 20 mg by mouth at bedtime.  methotrexate (RHEUMATREX) 2.5 MG tablet Take 15 mg by mouth once a week.     Multiple Vitamin (MULTIVITAMIN) tablet Take 1 tablet by mouth daily.     Omega-3 Fatty Acids (FISH OIL) 1000 MG CAPS Take 1,000 mg by mouth in the morning, at noon, and at bedtime.     simvastatin (ZOCOR) 40 MG tablet Take 40 mg by mouth every evening.     traZODone (DESYREL) 50 MG tablet Take 150 mg by mouth at bedtime.     No current facility-administered medications for this visit.    Past Medical History:  Diagnosis Date   Anxiety    Arthritis    Chronic back pain    Depression    GERD  (gastroesophageal reflux disease)    History of kidney stones    Hyperlipidemia    Hypertension    Neuromuscular disorder (HCC)    neuropathy    Neuropathy    RA (rheumatoid arthritis) ([place])     Past Surgical History:  Procedure Laterality Date   BIOPSY  07/19/2022   Procedure: BIOPSY;  Surgeon: Eloise Harman, Ryan Ibarra;  [facility];  Service: Endoscopy;;   CATARACT EXTRACTION W/PHACO Right 10/28/2013   Procedure: CATARACT EXTRACTION PHACO AND INTRAOCULAR LENS PLACEMENT RIGHT EYE;  Surgeon: Tonny Branch, MD;  Location: [facility];  Service: Ophthalmology;  Laterality: Right;  CDE:  22.51   COLONOSCOPY     [place]   COLONOSCOPY WITH PROPOFOL N/A 07/19/2022   Surgeon: Eloise Harman, Ryan Ibarra;   nonbleeding internal hemorrhoids, diverticulosis in the sigmoid colon, normal-appearing colon, random colon biopsies negative for microscopic colitis.   HAND / FINGER LESION EXCISION Right 70's   ring finger rt reattached   lower back fusion     TONSILLECTOMY     TOTAL KNEE ARTHROPLASTY Right 05/26/2014   Procedure: RIGHT TOTAL KNEE ARTHROPLASTY;  Surgeon: Gearlean Alf, MD;  [facility];  Service: Orthopedics;  Laterality: Right;    Family History  Problem Relation Age of Onset   Cancer - Colon Neg Hx    Inflammatory bowel disease Neg Hx    Celiac disease Neg Hx     Allergies as of 01/09/2023   (No Known Allergies)    Social History   Socioeconomic History   Marital status: Married    Spouse name: Not on file   Number of children: Not on file   Years of education: Not on file   Highest education level: Not on file  Occupational History   Not on file  Tobacco Use   Smoking status: Former    Packs/day: 1.00    Years: 15.00    Total pack years: 15.00    Types: Cigarettes    Quit date: 04/25/2010    Years since quitting: 12.7   Smokeless tobacco: Never  Vaping Use   Vaping Use: Never used  Substance and Sexual Activity   Alcohol use: Yes    Comment: rare   Drug  use: No   Sexual activity: Not on file  Other Topics Concern   Not on file  Social History Narrative   Not on file   Social Determinants of Health   Financial Resource Strain: Not on file  Food Insecurity: Not on file  Transportation Needs: Not on file  Physical Activity: Not on file  Stress: Not on file  Social Connections: Not on file     Review of Systems   Gen: Denies fever, chills, anorexia. Denies fatigue, weakness, weight loss.  CV: Denies chest pain, palpitations, syncope, peripheral edema, and claudication. Resp: Denies dyspnea at rest, cough, wheezing, coughing up blood, and pleurisy. GI: See HPI Derm: Denies rash, itching, dry skin Psych: Denies depression, anxiety, memory loss, confusion. No homicidal or suicidal ideation.  Heme: Denies bruising, bleeding, and enlarged lymph nodes.   Physical Exam   BP 137/76 (BP Location: Left Arm, Patient Position: Sitting, Cuff Size: Normal)   Pulse 88   Temp 98.4 F (36.9 C) (Temporal)   Ht '5\' 6"'$  (1.676 m)   Wt 175 lb 9.6 oz (79.7 kg)   SpO2 94%   BMI 28.34 kg/m   General:   Alert and oriented. No distress noted. Pleasant and cooperative.  Head:  Normocephalic and atraumatic. Eyes:  Conjuctiva clear without scleral icterus. Mouth:  Oral mucosa pink and moist. Good dentition. No lesions. Lungs:  Clear to auscultation bilaterally. No wheezes, rales, or rhonchi. No distress.  Heart:  S1, S2 present without murmurs appreciated.  Abdomen:  +BS, soft, non-distended. Mild ttp to RUQ. No rebound or guarding. No HSM or masses noted. Rectal: deferred Msk:  Symmetrical without gross deformities. Normal posture. Extremities:  Without edema. Neurologic:  Alert and  oriented x4 Psych:  Alert and cooperative. Normal mood and affect.   Assessment  Ryan Ibarra is a 77 y.o. male with a history of HTN, HLD, depression, anxiety, GERD, neuropathy, GERD, RA, and diarrhea presenting today for follow up.  Diarrhea/flatulence: Has  improvements of diarrhea if he takes Pepto-Bismol (dark/black stools noted).  Also on iron therapy.  Has tried different dietary eliminations without much help.  Every other week has 3-4 days of diarrhea with 2-3 episodes of loose stools usually after eating.  In order to combat this he has been trying to eat 1 meal a day and will not eat prior to leaving his house for appointments due to urgency and swelling that occurs after flatulence.  On the weeks that he does not have diarrhea he has a bowel movement every other day or so this is fairly normal for him.  Tends to complain of gas pain that begins with first bite of food.  Denies any nausea or vomiting.  Usually has lower abdominal pain associated with excess gas.  Does report some RUQ pain that seems worse with drinking coffee however feels as though his pain could be stented given his chronic opioid use. Colonoscopy up-to-date with benign random biopsies also normal TSH and celiac screen.  No prior stool studies on file.  Given some of the right lower quadrant pain biliary etiology remains within the differential as well as pancreatic etiology.  Discussed trial of Xifaxan for possible SIBO if diarrhea continues we will perform fecal elastase to assess pancreas and start on cholestyramine for possible bile acid diarrhea.  Advised to stop taking Pepto-Bismol start Imodium as needed.  Instructed patient to call in 3 weeks with an update and further recommendations if diarrhea continues.  Information regarding low FODMAP diet provided.  Will plan to follow-up in 2 months, or sooner if needed.  PLAN   Xifaxin 550 mg 3 times daily for 2 weeks.  Low Fodmap diet Cholestyramine trial if Xifaxin ineffective.  Fecal elastase if Xifaxin unhelpful.  Progress report in 3 weeks Stop Pepto-Bismol, use Imodium as needed. Follow up in office in 2 months.    Venetia Night, MSN, FNP-BC, AGACNP-BC [facility]

## 2023-01-09 ENCOUNTER — Encounter: Payer: Self-pay | Admitting: Gastroenterology

## 2023-01-09 ENCOUNTER — Ambulatory Visit (INDEPENDENT_AMBULATORY_CARE_PROVIDER_SITE_OTHER): Payer: Medicare Other | Admitting: Gastroenterology

## 2023-01-09 VITALS — BP 137/76 | HR 88 | Temp 98.4°F | Ht 66.0 in | Wt 175.6 lb

## 2023-01-09 DIAGNOSIS — R143 Flatulence: Secondary | ICD-10-CM

## 2023-01-09 DIAGNOSIS — R197 Diarrhea, unspecified: Secondary | ICD-10-CM

## 2023-01-09 MED ORDER — RIFAXIMIN 550 MG PO TABS: 550.0000 mg | ORAL_TABLET | Freq: Three times a day (TID) | ORAL | 0 refills | Status: AC

## 2023-01-09 NOTE — Patient Instructions (Addendum)
Please stop taking Pepto-Bismol, I would like for you to take Imodium as needed for your diarrhea.  I am sending in Xifaxan which is an antibiotic for you to take for 2 weeks.  Please let me know how you are doing 1 week after stopping the medication to see if diarrhea has returned.   While on the medication follow a low fat diet and then resume diet as you wish after and we can determine next step.  If you continue to have diarrhea we will plan to send in cholestyramine powder (4g) daily.  Also if Xifaxan is not helpful I would like to do a stool study to assess if there is any pancreas involvement regarding your diarrhea.   As we discussed in your visit, please call in 3 weeks or in between as needed if there is any difficulty in obtaining your medication or you are having worsening symptoms and I can provide further recommendations prior to seeing you again in the office in 8 weeks.   It was a pleasure to see you today. I want to create trusting relationships with patients. If you receive a survey regarding your visit,  I greatly appreciate you taking time to fill this out on paper or through your MyChart. I value your feedback.  Venetia Night, MSN, FNP-BC, AGACNP-BC [facility]

## 2023-01-11 ENCOUNTER — Other Ambulatory Visit: Payer: Self-pay | Admitting: Gastroenterology

## 2023-01-11 ENCOUNTER — Telehealth: Payer: Self-pay | Admitting: *Deleted

## 2023-01-11 DIAGNOSIS — R197 Diarrhea, unspecified: Secondary | ICD-10-CM

## 2023-01-11 MED ORDER — CHOLESTYRAMINE 4 G PO PACK: 4.0000 g | PACK | Freq: Every day | ORAL | 12 refills | Status: DC | PRN

## 2023-01-11 NOTE — Telephone Encounter (Signed)
Pt called and states Xifaxan '550mg'$  is to expensive. He states his copay is 700$ and he can't afford it. Would like to try something else?

## 2023-01-12 NOTE — Telephone Encounter (Signed)
Spoke to pt, informed him to have labs completed and that Cholestyramine was sent to pharmacy. Pt voiced understanding.

## 2023-01-17 DIAGNOSIS — M961 Postlaminectomy syndrome, not elsewhere classified: Secondary | ICD-10-CM | POA: Diagnosis not present

## 2023-01-17 DIAGNOSIS — M5416 Radiculopathy, lumbar region: Secondary | ICD-10-CM | POA: Diagnosis not present

## 2023-01-17 DIAGNOSIS — G603 Idiopathic progressive neuropathy: Secondary | ICD-10-CM | POA: Diagnosis not present

## 2023-02-07 DIAGNOSIS — Z85828 Personal history of other malignant neoplasm of skin: Secondary | ICD-10-CM | POA: Diagnosis not present

## 2023-02-07 DIAGNOSIS — L0100 Impetigo, unspecified: Secondary | ICD-10-CM | POA: Diagnosis not present

## 2023-02-07 DIAGNOSIS — L218 Other seborrheic dermatitis: Secondary | ICD-10-CM | POA: Diagnosis not present

## 2023-02-07 DIAGNOSIS — D239 Other benign neoplasm of skin, unspecified: Secondary | ICD-10-CM | POA: Diagnosis not present

## 2023-02-07 DIAGNOSIS — L570 Actinic keratosis: Secondary | ICD-10-CM | POA: Diagnosis not present

## 2023-03-06 ENCOUNTER — Encounter: Payer: Self-pay | Admitting: Gastroenterology

## 2023-03-06 ENCOUNTER — Ambulatory Visit (INDEPENDENT_AMBULATORY_CARE_PROVIDER_SITE_OTHER): Payer: Medicare Other | Admitting: Gastroenterology

## 2023-03-06 VITALS — BP 136/80 | HR 111 | Temp 97.5°F | Ht 66.0 in | Wt 176.0 lb

## 2023-03-06 DIAGNOSIS — R197 Diarrhea, unspecified: Secondary | ICD-10-CM | POA: Diagnosis not present

## 2023-03-06 NOTE — Patient Instructions (Addendum)
It was great to see you again today!  Continue the cholestyramine powder as needed.  Please reach out when you need a refill.  Will plan to follow-up in 1 year, sooner if needed.  Please not hesitate to call the office if you have any new concerns or worsening symptoms.   It was a pleasure to see you today. I want to create trusting relationships with patients. If you receive a survey regarding your visit,  I greatly appreciate you taking time to fill this out on paper or through your MyChart. I value your feedback.  Venetia Night, MSN, FNP-BC, AGACNP-BC [facility]

## 2023-03-06 NOTE — Progress Notes (Signed)
GI Office Note    Referring Provider: Neale Burly, MD Primary Care Physician:  Neale Burly, MD Primary Gastroenterologist: [name], DO  Date:  03/06/2023  ID:  Ryan Ibarra, DOB 09/24/1946, MRN [id number]   Chief Complaint   Chief Complaint  Patient presents with   Follow-up    Will discuss with provider   History of Present Illness  Ryan Ibarra is a 77 y.o. male with a history of HTN, HLD, depression, anxiety, GERD, neuropathy, GERD, RA, and diarrhea presenting today for follow up.   Initial consult on 06/23/2022.  He reported acute change in bowel habits from constipation to intermittent uncontrollable diarrhea in May 2023 with up to 10-15 watery bowel movements in a 24-hour period along with nocturnal stools.  Stools were foul-smelling with significant amount of foul-smelling gas.  Symptoms would resolve after couple days he would have soft, formed stools for several days, then symptoms returned.  He was taking large amounts of Pepto-Bismol during his diarrhea episodes and subsequently had dark stools.  No rectal bleeding.  He had a total of 6 separate episodes with last on June 27.  PCP had started him on dicyclomine as needed which did help significantly during last flare.  No associated abdominal pain.  Denied recent antibiotics, medication changes, recent illnesses, family history of IBD or colon cancer.  Recommended checking for celiac disease, thyroid abnormalities, and completing stool studies of watery diarrhea returned.  We would also proceed with a colonoscopy for further evaluation.   TSH and celiac screen within normal limits.  Stool studies not completed.   Colonoscopy 07/19/2022:  - Non-bleeding internal hemorrhoids. - Diverticulosis in the sigmoid colon. - Biopsies were taken with a cold forceps from the ascending colon, transverse colon and descending colon for evaluation of microscopic colitis. -Pathology was benign.  Last office visit 01/09/2023.   Patient reported having diarrhea every other week for 3-4 days in which she may go 2-3 times most days.  I been using Pepto-Bismol to cut back on episodes of diarrhea prior to eating.  Taking dicyclomine.  Fully had tried cutting out sodas.  Having bowel movement after eating and also lots of gas.  States with gas he may have some stool coming out.  Denied any nausea or vomiting.  Rare lower abdominal pain.  Has occasional gas pains.  Pain worse on the right side when drinking coffee.  Noted to be on chronic pain medication taking 6 oxycodone per day.  Reports Pepto works.  Has not taken Imodium.  Patient unhappy about his timeframe in between office visits.  Advised to try Xifaxan 550 mg 3 times daily for 2 weeks.  Follow-up FODMAP diet.  Trial of cholestyramine if Xifaxan ineffective and also to check fecal elastase.  Advised to stop Pepto-Bismol and use Imodium as needed.  Xifaxan was too expensive for patient therefore cholestyramine 4 g was sent in to use when diarrhea occurs and to help with in setting of constipation.  Orders placed to check fecal elastase which has not been performed.  Today: Did not feel well informed about the Xifaxin. Took cholestyramine first day was prescribed and did not need to take the medication for the last 6 weeks until last night. Quit taking iron and stool is not black. Is having gas and some loose stool. Took the cholestyramine last evening and so far it is working. Is okay with using his current regimen of cholestyramine as needed.  Denies any overt abdominal  pain.  Has no other complaints.  Reports he read multiple hours per day and if he finds anything interesting he will follow up on that.    Current Outpatient Medications  Medication Sig Dispense Refill   aspirin EC 81 MG tablet Take 81 mg by mouth daily.     cholestyramine (QUESTRAN) 4 g packet Take 1 packet (4 g total) by mouth daily as needed. When diarrhea occurs. Hold in the setting of constipation. 60  each 12   DULoxetine (CYMBALTA) 60 MG capsule Take 60 mg by mouth 2 (two) times daily.     famotidine (PEPCID) 20 MG tablet Take 20 mg by mouth daily.     fluticasone (FLONASE) 50 MCG/ACT nasal spray Place 2 sprays into both nostrils daily.     folic acid (FOLVITE) 1 MG tablet Take 1 mg by mouth daily.     gabapentin (NEURONTIN) 800 MG tablet Take 800 mg by mouth 5 (five) times daily as needed (pain).     HYDROcodone-acetaminophen (NORCO) 10-325 MG tablet Take 1 tablet by mouth every 4 (four) hours as needed for severe pain.     hydrocortisone 2.5 % ointment Apply 1 Application topically daily as needed for itching.     ketoconazole (NIZORAL) 2 % cream Apply 1 Application topically 2 (two) times daily as needed for irritation.     lisinopril (PRINIVIL,ZESTRIL) 20 MG tablet Take 20 mg by mouth at bedtime.      methotrexate (RHEUMATREX) 2.5 MG tablet Take 15 mg by mouth once a week.     Multiple Vitamin (MULTIVITAMIN) tablet Take 1 tablet by mouth daily.     Omega-3 Fatty Acids (FISH OIL) 1000 MG CAPS Take 1,000 mg by mouth in the morning, at noon, and at bedtime.     simvastatin (ZOCOR) 40 MG tablet Take 40 mg by mouth every evening.     traZODone (DESYREL) 50 MG tablet Take 150 mg by mouth at bedtime.     No current facility-administered medications for this visit.    Past Medical History:  Diagnosis Date   Anxiety    Arthritis    Chronic back pain    Depression    GERD (gastroesophageal reflux disease)    History of kidney stones    Hyperlipidemia    Hypertension    Neuromuscular disorder (HCC)    neuropathy    Neuropathy    RA (rheumatoid arthritis) ([place])     Past Surgical History:  Procedure Laterality Date   BIOPSY  07/19/2022   Procedure: BIOPSY;  Surgeon: Eloise Harman, DO;  Location: [facility];  Service: Endoscopy;;   CATARACT EXTRACTION W/PHACO Right 10/28/2013   Procedure: CATARACT EXTRACTION PHACO AND INTRAOCULAR LENS PLACEMENT RIGHT EYE;  Surgeon: Tonny Branch, MD;  Location: [facility];  Service: Ophthalmology;  Laterality: Right;  CDE:  22.51   COLONOSCOPY     [place]   COLONOSCOPY WITH PROPOFOL N/A 07/19/2022   Surgeon: Eloise Harman, DO;   nonbleeding internal hemorrhoids, diverticulosis in the sigmoid colon, normal-appearing colon, random colon biopsies negative for microscopic colitis.   HAND / FINGER LESION EXCISION Right 70's   ring finger rt reattached   lower back fusion     TONSILLECTOMY     TOTAL KNEE ARTHROPLASTY Right 05/26/2014   Procedure: RIGHT TOTAL KNEE ARTHROPLASTY;  Surgeon: Gearlean Alf, MD;  Location: [facility];  Service: Orthopedics;  Laterality: Right;    Family History  Problem Relation Age of Onset   Cancer -  Colon Neg Hx    Inflammatory bowel disease Neg Hx    Celiac disease Neg Hx     Allergies as of 03/06/2023   (No Known Allergies)    Social History   Socioeconomic History   Marital status: Married    Spouse name: Not on file   Number of children: Not on file   Years of education: Not on file   Highest education level: Not on file  Occupational History   Not on file  Tobacco Use   Smoking status: Former    Packs/day: 1.00    Years: 15.00    Additional pack years: 0.00    Total pack years: 15.00    Types: Cigarettes    Quit date: 04/25/2010    Years since quitting: 12.8   Smokeless tobacco: Never  Vaping Use   Vaping Use: Never used  Substance and Sexual Activity   Alcohol use: Yes    Comment: rare   Drug use: No   Sexual activity: Not Currently  Other Topics Concern   Not on file  Social History Narrative   Not on file   Social Determinants of Health   Financial Resource Strain: Not on file  Food Insecurity: Not on file  Transportation Needs: Not on file  Physical Activity: Not on file  Stress: Not on file  Social Connections: Not on file     Review of Systems   Gen: Denies fever, chills, anorexia. Denies fatigue, weakness, weight loss.  CV: Denies chest pain,  palpitations, syncope, peripheral edema, and claudication. Resp: Denies dyspnea at rest, cough, wheezing, coughing up blood, and pleurisy. GI: See HPI Derm: Denies rash, itching, dry skin Psych: Denies depression, anxiety, memory loss, confusion. No homicidal or suicidal ideation.  Heme: Denies bruising, bleeding, and enlarged lymph nodes.   Physical Exam   BP (!) 150/87 (BP Location: Right Arm, Patient Position: Sitting, Cuff Size: Normal)   Pulse (!) 111   Temp (!) 97.5 F (36.4 C) (Temporal)   Ht 5\' 6"  (1.676 m)   Wt 176 lb (79.8 kg)   BMI 28.41 kg/m   General:   Alert and oriented. No distress noted. Pleasant and cooperative.  Head:  Normocephalic and atraumatic. Eyes:  Conjuctiva clear without scleral icterus. Mouth:  Oral mucosa pink and moist. Good dentition. No lesions. Abdomen:  +BS, soft, non-tender and non-distended. No rebound or guarding. No HSM or masses noted. Rectal: deferred Msk:  Symmetrical without gross deformities. Normal posture. Extremities:  Without edema. Neurologic:  Alert and  oriented x4 Psych:  Alert and cooperative. Normal mood and affect.   Assessment  Ryan Ibarra is a 77 y.o. male with a history of HTN, HLD, depression, anxiety, GERD, neuropathy, GERD, RA, and diarrhea presenting today for follow up.   Diarrhea: Previously having diarrhea every other week for 3-4 days and may go 2-3 times per day.  At last visit discussed trial of Xifaxan however medication would have been very expensive.  We sent in cholestyramine 4 g powder to use daily in place of Xifaxan.  Patient took 1 dose and has had improvement of diarrhea since.  Still has some occasional bloating but overall improved and happy with using cholestyramine as needed.  PLAN   Cholestyramine 4 g as needed.  Follow up in 1 year, sooner if needed.     Venetia Night, MSN, FNP-BC, AGACNP-BC [facility]

## 2023-03-14 DIAGNOSIS — M961 Postlaminectomy syndrome, not elsewhere classified: Secondary | ICD-10-CM | POA: Diagnosis not present

## 2023-03-14 DIAGNOSIS — G603 Idiopathic progressive neuropathy: Secondary | ICD-10-CM | POA: Diagnosis not present

## 2023-03-14 DIAGNOSIS — M5416 Radiculopathy, lumbar region: Secondary | ICD-10-CM | POA: Diagnosis not present

## 2023-03-27 DIAGNOSIS — M5432 Sciatica, left side: Secondary | ICD-10-CM | POA: Diagnosis not present

## 2023-03-27 DIAGNOSIS — E782 Mixed hyperlipidemia: Secondary | ICD-10-CM | POA: Diagnosis not present

## 2023-03-27 DIAGNOSIS — K2100 Gastro-esophageal reflux disease with esophagitis, without bleeding: Secondary | ICD-10-CM | POA: Diagnosis not present

## 2023-03-27 DIAGNOSIS — I10 Essential (primary) hypertension: Secondary | ICD-10-CM | POA: Diagnosis not present

## 2023-03-27 DIAGNOSIS — G629 Polyneuropathy, unspecified: Secondary | ICD-10-CM | POA: Diagnosis not present

## 2023-03-27 DIAGNOSIS — M05642 Rheumatoid arthritis of left hand with involvement of other organs and systems: Secondary | ICD-10-CM | POA: Diagnosis not present

## 2023-05-04 DIAGNOSIS — D0462 Carcinoma in situ of skin of left upper limb, including shoulder: Secondary | ICD-10-CM | POA: Diagnosis not present

## 2023-05-10 DIAGNOSIS — M5416 Radiculopathy, lumbar region: Secondary | ICD-10-CM | POA: Diagnosis not present

## 2023-06-02 DIAGNOSIS — I6523 Occlusion and stenosis of bilateral carotid arteries: Secondary | ICD-10-CM | POA: Diagnosis not present

## 2023-06-05 DIAGNOSIS — R0989 Other specified symptoms and signs involving the circulatory and respiratory systems: Secondary | ICD-10-CM | POA: Diagnosis not present

## 2023-06-05 DIAGNOSIS — I6523 Occlusion and stenosis of bilateral carotid arteries: Secondary | ICD-10-CM | POA: Diagnosis not present

## 2023-06-26 DIAGNOSIS — E782 Mixed hyperlipidemia: Secondary | ICD-10-CM | POA: Diagnosis not present

## 2023-06-26 DIAGNOSIS — N182 Chronic kidney disease, stage 2 (mild): Secondary | ICD-10-CM | POA: Diagnosis not present

## 2023-06-26 DIAGNOSIS — K2100 Gastro-esophageal reflux disease with esophagitis, without bleeding: Secondary | ICD-10-CM | POA: Diagnosis not present

## 2023-06-26 DIAGNOSIS — G629 Polyneuropathy, unspecified: Secondary | ICD-10-CM | POA: Diagnosis not present

## 2023-06-26 DIAGNOSIS — I10 Essential (primary) hypertension: Secondary | ICD-10-CM | POA: Diagnosis not present

## 2023-06-26 DIAGNOSIS — M05642 Rheumatoid arthritis of left hand with involvement of other organs and systems: Secondary | ICD-10-CM | POA: Diagnosis not present

## 2023-07-04 DIAGNOSIS — G603 Idiopathic progressive neuropathy: Secondary | ICD-10-CM | POA: Diagnosis not present

## 2023-07-04 DIAGNOSIS — M961 Postlaminectomy syndrome, not elsewhere classified: Secondary | ICD-10-CM | POA: Diagnosis not present

## 2023-07-04 DIAGNOSIS — M5416 Radiculopathy, lumbar region: Secondary | ICD-10-CM | POA: Diagnosis not present

## 2023-07-10 ENCOUNTER — Other Ambulatory Visit: Payer: Self-pay | Admitting: Gastroenterology

## 2023-07-10 DIAGNOSIS — R197 Diarrhea, unspecified: Secondary | ICD-10-CM

## 2023-08-15 DIAGNOSIS — L723 Sebaceous cyst: Secondary | ICD-10-CM | POA: Diagnosis not present

## 2023-08-15 DIAGNOSIS — Z85828 Personal history of other malignant neoplasm of skin: Secondary | ICD-10-CM | POA: Diagnosis not present

## 2023-08-15 DIAGNOSIS — L218 Other seborrheic dermatitis: Secondary | ICD-10-CM | POA: Diagnosis not present

## 2023-08-15 DIAGNOSIS — L570 Actinic keratosis: Secondary | ICD-10-CM | POA: Diagnosis not present

## 2023-09-04 DIAGNOSIS — M5416 Radiculopathy, lumbar region: Secondary | ICD-10-CM | POA: Diagnosis not present

## 2023-09-04 DIAGNOSIS — M961 Postlaminectomy syndrome, not elsewhere classified: Secondary | ICD-10-CM | POA: Diagnosis not present

## 2023-09-04 DIAGNOSIS — G603 Idiopathic progressive neuropathy: Secondary | ICD-10-CM | POA: Diagnosis not present

## 2023-09-25 DIAGNOSIS — N182 Chronic kidney disease, stage 2 (mild): Secondary | ICD-10-CM | POA: Diagnosis not present

## 2023-09-25 DIAGNOSIS — K2100 Gastro-esophageal reflux disease with esophagitis, without bleeding: Secondary | ICD-10-CM | POA: Diagnosis not present

## 2023-09-25 DIAGNOSIS — I10 Essential (primary) hypertension: Secondary | ICD-10-CM | POA: Diagnosis not present

## 2023-09-25 DIAGNOSIS — G629 Polyneuropathy, unspecified: Secondary | ICD-10-CM | POA: Diagnosis not present

## 2023-09-25 DIAGNOSIS — M05642 Rheumatoid arthritis of left hand with involvement of other organs and systems: Secondary | ICD-10-CM | POA: Diagnosis not present

## 2023-09-25 DIAGNOSIS — Z125 Encounter for screening for malignant neoplasm of prostate: Secondary | ICD-10-CM | POA: Diagnosis not present

## 2023-09-25 DIAGNOSIS — E782 Mixed hyperlipidemia: Secondary | ICD-10-CM | POA: Diagnosis not present

## 2023-10-30 DIAGNOSIS — M961 Postlaminectomy syndrome, not elsewhere classified: Secondary | ICD-10-CM | POA: Diagnosis not present

## 2023-10-30 DIAGNOSIS — M546 Pain in thoracic spine: Secondary | ICD-10-CM | POA: Diagnosis not present

## 2024-01-01 DIAGNOSIS — M961 Postlaminectomy syndrome, not elsewhere classified: Secondary | ICD-10-CM | POA: Diagnosis not present

## 2024-01-01 DIAGNOSIS — M5416 Radiculopathy, lumbar region: Secondary | ICD-10-CM | POA: Diagnosis not present

## 2024-01-01 DIAGNOSIS — K2100 Gastro-esophageal reflux disease with esophagitis, without bleeding: Secondary | ICD-10-CM | POA: Diagnosis not present

## 2024-01-01 DIAGNOSIS — I10 Essential (primary) hypertension: Secondary | ICD-10-CM | POA: Diagnosis not present

## 2024-01-01 DIAGNOSIS — M05642 Rheumatoid arthritis of left hand with involvement of other organs and systems: Secondary | ICD-10-CM | POA: Diagnosis not present

## 2024-01-01 DIAGNOSIS — E782 Mixed hyperlipidemia: Secondary | ICD-10-CM | POA: Diagnosis not present

## 2024-01-01 DIAGNOSIS — N182 Chronic kidney disease, stage 2 (mild): Secondary | ICD-10-CM | POA: Diagnosis not present

## 2024-01-01 DIAGNOSIS — G603 Idiopathic progressive neuropathy: Secondary | ICD-10-CM | POA: Diagnosis not present

## 2024-01-01 DIAGNOSIS — G629 Polyneuropathy, unspecified: Secondary | ICD-10-CM | POA: Diagnosis not present

## 2024-02-12 DIAGNOSIS — L570 Actinic keratosis: Secondary | ICD-10-CM | POA: Diagnosis not present

## 2024-02-12 DIAGNOSIS — Z85828 Personal history of other malignant neoplasm of skin: Secondary | ICD-10-CM | POA: Diagnosis not present

## 2024-02-12 DIAGNOSIS — L218 Other seborrheic dermatitis: Secondary | ICD-10-CM | POA: Diagnosis not present

## 2024-02-16 ENCOUNTER — Encounter: Payer: Self-pay | Admitting: Gastroenterology

## 2024-02-27 DIAGNOSIS — M961 Postlaminectomy syndrome, not elsewhere classified: Secondary | ICD-10-CM | POA: Diagnosis not present

## 2024-02-27 DIAGNOSIS — M5416 Radiculopathy, lumbar region: Secondary | ICD-10-CM | POA: Diagnosis not present

## 2024-04-08 DIAGNOSIS — I10 Essential (primary) hypertension: Secondary | ICD-10-CM | POA: Diagnosis not present

## 2024-04-08 DIAGNOSIS — M05642 Rheumatoid arthritis of left hand with involvement of other organs and systems: Secondary | ICD-10-CM | POA: Diagnosis not present

## 2024-04-08 DIAGNOSIS — K2100 Gastro-esophageal reflux disease with esophagitis, without bleeding: Secondary | ICD-10-CM | POA: Diagnosis not present

## 2024-04-08 DIAGNOSIS — G629 Polyneuropathy, unspecified: Secondary | ICD-10-CM | POA: Diagnosis not present

## 2024-04-08 DIAGNOSIS — E782 Mixed hyperlipidemia: Secondary | ICD-10-CM | POA: Diagnosis not present

## 2024-04-08 DIAGNOSIS — S91242A Puncture wound with foreign body of left great toe with damage to nail, initial encounter: Secondary | ICD-10-CM | POA: Diagnosis not present

## 2024-04-08 DIAGNOSIS — N182 Chronic kidney disease, stage 2 (mild): Secondary | ICD-10-CM | POA: Diagnosis not present

## 2024-04-08 DIAGNOSIS — Z6827 Body mass index (BMI) 27.0-27.9, adult: Secondary | ICD-10-CM | POA: Diagnosis not present

## 2024-04-25 DIAGNOSIS — M5416 Radiculopathy, lumbar region: Secondary | ICD-10-CM | POA: Diagnosis not present

## 2024-04-25 DIAGNOSIS — M961 Postlaminectomy syndrome, not elsewhere classified: Secondary | ICD-10-CM | POA: Diagnosis not present

## 2024-07-01 DIAGNOSIS — M961 Postlaminectomy syndrome, not elsewhere classified: Secondary | ICD-10-CM | POA: Diagnosis not present

## 2024-07-01 DIAGNOSIS — M5416 Radiculopathy, lumbar region: Secondary | ICD-10-CM | POA: Diagnosis not present

## 2024-07-16 DIAGNOSIS — N182 Chronic kidney disease, stage 2 (mild): Secondary | ICD-10-CM | POA: Diagnosis not present

## 2024-07-16 DIAGNOSIS — I10 Essential (primary) hypertension: Secondary | ICD-10-CM | POA: Diagnosis not present

## 2024-07-16 DIAGNOSIS — E782 Mixed hyperlipidemia: Secondary | ICD-10-CM | POA: Diagnosis not present

## 2024-07-16 DIAGNOSIS — M05642 Rheumatoid arthritis of left hand with involvement of other organs and systems: Secondary | ICD-10-CM | POA: Diagnosis not present

## 2024-07-16 DIAGNOSIS — G629 Polyneuropathy, unspecified: Secondary | ICD-10-CM | POA: Diagnosis not present

## 2024-07-16 DIAGNOSIS — S91242A Puncture wound with foreign body of left great toe with damage to nail, initial encounter: Secondary | ICD-10-CM | POA: Diagnosis not present

## 2024-07-16 DIAGNOSIS — K2100 Gastro-esophageal reflux disease with esophagitis, without bleeding: Secondary | ICD-10-CM | POA: Diagnosis not present

## 2024-07-24 DIAGNOSIS — Z6826 Body mass index (BMI) 26.0-26.9, adult: Secondary | ICD-10-CM | POA: Diagnosis not present

## 2024-07-24 DIAGNOSIS — L03032 Cellulitis of left toe: Secondary | ICD-10-CM | POA: Diagnosis not present

## 2024-07-24 DIAGNOSIS — R030 Elevated blood-pressure reading, without diagnosis of hypertension: Secondary | ICD-10-CM | POA: Diagnosis not present

## 2024-07-24 DIAGNOSIS — E663 Overweight: Secondary | ICD-10-CM | POA: Diagnosis not present

## 2024-07-30 DIAGNOSIS — L0390 Cellulitis, unspecified: Secondary | ICD-10-CM | POA: Diagnosis not present

## 2024-07-30 DIAGNOSIS — D692 Other nonthrombocytopenic purpura: Secondary | ICD-10-CM | POA: Diagnosis not present

## 2024-07-30 DIAGNOSIS — L570 Actinic keratosis: Secondary | ICD-10-CM | POA: Diagnosis not present

## 2024-09-03 DIAGNOSIS — M5416 Radiculopathy, lumbar region: Secondary | ICD-10-CM | POA: Diagnosis not present

## 2024-09-03 DIAGNOSIS — Z79899 Other long term (current) drug therapy: Secondary | ICD-10-CM | POA: Diagnosis not present

## 2024-09-03 DIAGNOSIS — G603 Idiopathic progressive neuropathy: Secondary | ICD-10-CM | POA: Diagnosis not present

## 2024-10-16 DIAGNOSIS — I10 Essential (primary) hypertension: Secondary | ICD-10-CM | POA: Diagnosis not present

## 2024-10-16 DIAGNOSIS — G629 Polyneuropathy, unspecified: Secondary | ICD-10-CM | POA: Diagnosis not present

## 2024-10-16 DIAGNOSIS — D692 Other nonthrombocytopenic purpura: Secondary | ICD-10-CM | POA: Diagnosis not present

## 2024-10-16 DIAGNOSIS — Z125 Encounter for screening for malignant neoplasm of prostate: Secondary | ICD-10-CM | POA: Diagnosis not present

## 2024-10-16 DIAGNOSIS — K2100 Gastro-esophageal reflux disease with esophagitis, without bleeding: Secondary | ICD-10-CM | POA: Diagnosis not present

## 2024-10-16 DIAGNOSIS — E782 Mixed hyperlipidemia: Secondary | ICD-10-CM | POA: Diagnosis not present

## 2024-10-16 DIAGNOSIS — M05642 Rheumatoid arthritis of left hand with involvement of other organs and systems: Secondary | ICD-10-CM | POA: Diagnosis not present

## 2024-10-16 DIAGNOSIS — N182 Chronic kidney disease, stage 2 (mild): Secondary | ICD-10-CM | POA: Diagnosis not present

## 2024-10-30 DIAGNOSIS — G603 Idiopathic progressive neuropathy: Secondary | ICD-10-CM | POA: Diagnosis not present

## 2024-10-30 DIAGNOSIS — M5416 Radiculopathy, lumbar region: Secondary | ICD-10-CM | POA: Diagnosis not present

## 2024-10-30 DIAGNOSIS — M961 Postlaminectomy syndrome, not elsewhere classified: Secondary | ICD-10-CM | POA: Diagnosis not present
